# Patient Record
Sex: Female | Born: 1965 | Race: Black or African American | Hispanic: No | Marital: Single | Smoking: Former smoker
Health system: Southern US, Community
[De-identification: ages and names within clinical notes are randomized; demographics above are authoritative.]

## PROBLEM LIST (undated history)

## (undated) DIAGNOSIS — J45909 Unspecified asthma, uncomplicated: Secondary | ICD-10-CM

## (undated) HISTORY — PX: BREAST LUMPECTOMY: SHX2

## (undated) HISTORY — PX: BREAST EXCISIONAL BIOPSY: SUR124

## (undated) HISTORY — PX: WRIST GANGLION EXCISION: SHX840

---

## 2000-03-05 ENCOUNTER — Encounter (INDEPENDENT_AMBULATORY_CARE_PROVIDER_SITE_OTHER): Payer: Self-pay | Admitting: Specialist

## 2000-03-05 ENCOUNTER — Ambulatory Visit (HOSPITAL_BASED_OUTPATIENT_CLINIC_OR_DEPARTMENT_OTHER): Admission: RE | Admit: 2000-03-05 | Discharge: 2000-03-05 | Payer: Self-pay | Admitting: Orthopedic Surgery

## 2000-07-09 ENCOUNTER — Encounter: Payer: Self-pay | Admitting: Internal Medicine

## 2000-07-09 ENCOUNTER — Encounter: Admission: RE | Admit: 2000-07-09 | Discharge: 2000-07-09 | Payer: Self-pay | Admitting: Internal Medicine

## 2002-04-01 ENCOUNTER — Encounter (INDEPENDENT_AMBULATORY_CARE_PROVIDER_SITE_OTHER): Payer: Self-pay | Admitting: *Deleted

## 2002-04-01 ENCOUNTER — Ambulatory Visit (HOSPITAL_BASED_OUTPATIENT_CLINIC_OR_DEPARTMENT_OTHER): Admission: RE | Admit: 2002-04-01 | Discharge: 2002-04-01 | Payer: Self-pay | Admitting: General Surgery

## 2003-03-04 ENCOUNTER — Encounter: Admission: RE | Admit: 2003-03-04 | Discharge: 2003-03-04 | Payer: Self-pay | Admitting: General Surgery

## 2003-03-04 ENCOUNTER — Encounter (INDEPENDENT_AMBULATORY_CARE_PROVIDER_SITE_OTHER): Payer: Self-pay | Admitting: *Deleted

## 2003-03-04 ENCOUNTER — Ambulatory Visit (HOSPITAL_BASED_OUTPATIENT_CLINIC_OR_DEPARTMENT_OTHER): Admission: RE | Admit: 2003-03-04 | Discharge: 2003-03-04 | Payer: Self-pay | Admitting: General Surgery

## 2003-03-04 ENCOUNTER — Encounter: Payer: Self-pay | Admitting: General Surgery

## 2003-03-04 ENCOUNTER — Ambulatory Visit (HOSPITAL_COMMUNITY): Admission: RE | Admit: 2003-03-04 | Discharge: 2003-03-04 | Payer: Self-pay | Admitting: General Surgery

## 2003-07-20 ENCOUNTER — Ambulatory Visit (HOSPITAL_COMMUNITY): Admission: RE | Admit: 2003-07-20 | Discharge: 2003-07-20 | Payer: Self-pay | Admitting: Obstetrics & Gynecology

## 2003-09-17 ENCOUNTER — Inpatient Hospital Stay (HOSPITAL_COMMUNITY): Admission: AD | Admit: 2003-09-17 | Discharge: 2003-09-17 | Payer: Self-pay | Admitting: Obstetrics

## 2003-10-08 ENCOUNTER — Ambulatory Visit (HOSPITAL_COMMUNITY): Admission: RE | Admit: 2003-10-08 | Discharge: 2003-10-08 | Payer: Self-pay | Admitting: Obstetrics & Gynecology

## 2003-10-23 ENCOUNTER — Inpatient Hospital Stay (HOSPITAL_COMMUNITY): Admission: AD | Admit: 2003-10-23 | Discharge: 2003-10-23 | Payer: Self-pay | Admitting: Obstetrics

## 2003-11-26 ENCOUNTER — Inpatient Hospital Stay (HOSPITAL_COMMUNITY): Admission: AD | Admit: 2003-11-26 | Discharge: 2003-11-26 | Payer: Self-pay | Admitting: Obstetrics

## 2003-12-16 ENCOUNTER — Inpatient Hospital Stay (HOSPITAL_COMMUNITY): Admission: AD | Admit: 2003-12-16 | Discharge: 2003-12-16 | Payer: Self-pay | Admitting: Obstetrics & Gynecology

## 2003-12-17 ENCOUNTER — Inpatient Hospital Stay (HOSPITAL_COMMUNITY): Admission: AD | Admit: 2003-12-17 | Discharge: 2003-12-20 | Payer: Self-pay | Admitting: Obstetrics & Gynecology

## 2004-04-20 ENCOUNTER — Ambulatory Visit (HOSPITAL_COMMUNITY): Admission: RE | Admit: 2004-04-20 | Discharge: 2004-04-20 | Payer: Self-pay | Admitting: Obstetrics & Gynecology

## 2006-01-16 ENCOUNTER — Encounter: Admission: RE | Admit: 2006-01-16 | Discharge: 2006-01-16 | Payer: Self-pay | Admitting: Obstetrics & Gynecology

## 2006-02-20 ENCOUNTER — Encounter: Admission: RE | Admit: 2006-02-20 | Discharge: 2006-02-20 | Payer: Self-pay | Admitting: General Surgery

## 2009-04-04 ENCOUNTER — Encounter: Admission: RE | Admit: 2009-04-04 | Discharge: 2009-04-04 | Payer: Self-pay | Admitting: Obstetrics

## 2009-08-09 ENCOUNTER — Ambulatory Visit: Payer: Self-pay | Admitting: Interventional Radiology

## 2009-08-09 ENCOUNTER — Emergency Department (HOSPITAL_BASED_OUTPATIENT_CLINIC_OR_DEPARTMENT_OTHER): Admission: EM | Admit: 2009-08-09 | Discharge: 2009-08-10 | Payer: Self-pay | Admitting: Emergency Medicine

## 2010-05-09 ENCOUNTER — Encounter: Admission: RE | Admit: 2010-05-09 | Discharge: 2010-05-09 | Payer: Self-pay | Admitting: Obstetrics

## 2010-07-01 ENCOUNTER — Encounter: Payer: Self-pay | Admitting: Obstetrics & Gynecology

## 2010-07-02 ENCOUNTER — Encounter: Payer: Self-pay | Admitting: Obstetrics & Gynecology

## 2010-10-27 NOTE — Op Note (Signed)
   NAME:  Whitney Williams, Whitney Williams                       ACCOUNT NO.:  [id number]   MEDICAL RECORD NO.:  [id number]                   PATIENT TYPE:  AMB   LOCATION:  DSC                                  FACILITY:  [facility]   PHYSICIAN:  Rose Phi. Maple Hudson, M.D.                DATE OF BIRTH:  23-May-1966   DATE OF PROCEDURE:  03/04/2003  DATE OF DISCHARGE:                                 OPERATIVE REPORT   PREOPERATIVE DIAGNOSIS:  Fibroadenoma of the right breast.   POSTOPERATIVE DIAGNOSIS:  Fibroadenoma of the right breast.   OPERATION:  Excision of right breast mass with needle localization and  specimen mammography.   SURGEON:  Rose Phi. Maple Hudson, M.D.   ANESTHESIA:  MAC.   OPERATIVE PROCEDURE:  The patient was placed on the operating table with the  right arm extended on the arm board and the breast prepped and draped in the  usual fashion.  A curvilinear incision centered on the wire, previously  positioned, was then outlined at about the 7 o'clock position of the right  breast.  After infiltrating with 1% Xylocaine, a curvilinear incision was  then made and then the wire and surrounding tissue were excised.   Specimen mammography confirmed the removal of the lesion.   Hemostasis obtained with the cautery.  Skin closed with interrupted 4-0  Monocryl in a subcuticular fashion.  Steri-Strips applied.  Dressing applied  and patient transferred to recovery room in satisfactory condition, having  tolerated the procedure well.                                               Rose Phi. Maple Hudson, M.D.    PRY/MEDQ  D:  03/04/2003  T:  03/04/2003  Job:  [id number]

## 2010-10-27 NOTE — Discharge Summary (Signed)
NAME:  Whitney Williams, Whitney Williams                       ACCOUNT NO.:  [id number]   MEDICAL RECORD NO.:  [id number]                   PATIENT TYPE:  INP   LOCATION:  9116                                 FACILITY:  [facility]   PHYSICIAN:  Charles A. Clearance Coots, M.D.             DATE OF BIRTH:  Nov 17, 1965   DATE OF ADMISSION:  12/17/2003  DATE OF DISCHARGE:  12/20/2003                                 DISCHARGE SUMMARY   ADMITTING DIAGNOSES:  1. Thirty-nine weeks gestation.  2. Active labor.   DISCHARGE DIAGNOSES:  1. Thirty-nine weeks gestation.  2. Active labor.  3. Status post normal spontaneous vaginal delivery of viable female on December 17, 2003; Apgars of 9 at one minute, 9 at five minutes; weight of 3305 g.     Mother and infant discharged home in good condition.   REASON FOR ADMISSION:  A 45 year old G3 P2 with estimated date of  confinement of December 22, 2003 presents with uterine contractions every 2-6  minutes.  The patient was kept for observation to rule out labor.  Uterine  contractions became stronger and cervical exam changed and the patient was  therefore admitted for early active labor.  Prenatal course was  uncomplicated.  Group B strep was positive.  The patient's past GYN history  is significant for fibroids.   PAST MEDICAL HISTORY:  Surgery:  None.  Illnesses:  None.   MEDICATIONS:  Prenatal vitamins.   ALLERGIES:  No known drug allergies.   SOCIAL HISTORY:  Single.  Negative tobacco, alcohol, or recreational drug  use.   PHYSICAL EXAMINATION:  VITAL SIGNS:  Temperature 98, vital signs were  stable.  LUNGS:  Clear to auscultation bilaterally.  HEART:  Regular rate and rhythm.  ABDOMEN:  Gravid, nontender.  PELVIC:  Cervix 5 cm dilated, 90% effaced, and the vertex at a -1 station.   ADMITTING LABORATORY VALUES:  Hemoglobin 10.9; hematocrit 34.8; white blood  cell count 7200; platelets 221,000.   HOSPITAL COURSE:  The patient was admitted and progressed quite well in  labor.  Membranes were actively ruptured at 1500 and revealed meconium-  stained fluid.  Intrauterine pressure catheter and internal fetal scalp  electrode were applied.  Amnioinfusion was begun.  The patient reached full  dilatation of the cervix at 1800 and progressed to a vaginal delivery at  2101.  There were no intrapartum complications.  The postpartum course was  complicated by postpartum endometritis.  The patient was started on IV  antibiotic therapy and did quite well and was discharged home much improved  on postpartum day #3.  Endometritis resolved.   DISCHARGE LABORATORY VALUES:  Hemoglobin 10.3; hematocrit 32.6; white blood  cell count 11,700; platelets 222,000.   DISCHARGE DISPOSITION:  1. Medication:  Tylox and ibuprofen prescribed for pain; Levaquin prescribed     for completion of antibiotic therapy for endometritis.  2. Routine written instructions per booklet for obstetrical  discharge was     given.  3. The patient is to follow up in the office in 6 weeks for a postpartum     check.                                               Charles A. Clearance Coots, M.D.    CAH/MEDQ  D:  01/21/2004  T:  01/21/2004  Job:  [id number]

## 2010-10-27 NOTE — Op Note (Signed)
   NAME:  Whitney Williams, Whitney Williams                       ACCOUNT NO.:  [id number]   MEDICAL RECORD NO.:  [id number]                   PATIENT TYPE:  AMB   LOCATION:  DSC                                  FACILITY:  [facility]   PHYSICIAN:  Rose Phi. Maple Hudson, M.D.                DATE OF BIRTH:  09/29/1965   DATE OF PROCEDURE:  04/01/2002  DATE OF DISCHARGE:                                 OPERATIVE REPORT   PREOPERATIVE DIAGNOSES:  Probable fibroadenoma of the right breast.   POSTOPERATIVE DIAGNOSES:  Nodular breast tissue with possible fibroadenoma.   OPERATION:  Excision of right breast mass.   SURGEON:  Rose Phi. Maple Hudson, M.D.   ANESTHESIA:  MAC.   DESCRIPTION OF PROCEDURE:  This patient had had a mammogram done because she  had some pain in her left breast. The left breast was normal but the  mammogram showed an ovoid small mass at the 8 o'clock position. Ultrasound  made it look like a fibroadenoma. She desired excision.   She was placed on the operating table and I was really unable to palpate it,  so we got the ultrasound and with the 7.5 MHz transducer, we were able to  identify it at about the 8:30 position. I marked that.   We then prepped and draped the breast in a standard fashion. A small curved  incision was made overlying this area and the area was infiltrated with 1%  Xylocaine with adrenaline. An incision was made and with sharp dissection,  we excised this nodular area of breast tissue. I really could not clearly  identify a fibroadenoma. I also could not palpate one with my fingers in the  incision specifically other than the nodular breast tissue including a small  lipoma that we excised. Hemostasis was obtained with the cautery.  Subcuticular closure with 4-0 Monocryl and Steri-Strips carried out.  Dressing applied. The patient transferred to the recovery room in  satisfactory condition having tolerated the procedure well.                                               Rose Phi. Maple Hudson, M.D.    PRY/MEDQ  D:  04/01/2002  T:  04/01/2002  Job:  [id number]

## 2010-10-27 NOTE — Op Note (Signed)
[place]. [facility]  Patient:    Whitney Williams, Whitney Williams                      MRN: [id number] Proc. Date: 03/05/00 Adm. Date:  [id number] Attending:  Susa Day                           Operative Report  PREOPERATIVE DIAGNOSIS:  Painful right dorsal ganglion.  POSTOPERATIVE DIAGNOSIS:  Painful right dorsal ganglion.  PROCEDURE:  Excision of a right dorsal ganglion.  SURGEON:  Katy Fitch. Sypher, Montez Hageman., M.D.  ASSISTANT:  Jonni Sanger, P.A.  ANESTHESIA:  IV regional supervised by the anesthesiologist, J. Claybon Jabs, M.D.  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  She has a history of experiencing ganglion formation of both hands.  She now has an enlarging dorsal ganglion on the right that is painful and interfering with function. She requested this to be removed.  DESCRIPTION OF PROCEDURE:  [name] was brought to the operating room and placed in the supine position on the operating table.  Following light sedation, an IV regional block was placed on the right.  When anesthesia was satisfactory, the procedure commenced with a short transverse incision directly over the mass.  Subcutaneous tissue was carefully divided, taking care to identify the extensor retinaculum.  This was carefully retracted in a proximal direction, and the cyst was circumferentially dissected down to the dorsal wrist capsule.  This was exiting directly over the scapholunate ligament.  The entire cyst and the portion of the capsule with the origin of the cyst was removed.  Bleeding points were electrocauterized with bipolar current.  The scapholunate and interosseous ligament were inspected and found to be otherwise normal.  The capsule was repaired with mattress suture of 4-0 Vicryl followed by repair of the skin with intradermal 3-0 Prolene suture.  There were no  apparent complications.  [name] tolerated the surgery and anesthesia well.  She was transported to the recovery room with stable vital signs. DD:  03/05/00 TD:  03/06/00 Job: [id number]

## 2012-10-16 ENCOUNTER — Ambulatory Visit (INDEPENDENT_AMBULATORY_CARE_PROVIDER_SITE_OTHER): Payer: BC Managed Care – PPO | Admitting: Obstetrics

## 2012-10-16 ENCOUNTER — Ambulatory Visit: Payer: Self-pay | Admitting: Obstetrics

## 2012-10-16 ENCOUNTER — Encounter: Payer: Self-pay | Admitting: Obstetrics

## 2012-10-16 VITALS — BP 123/77 | HR 100 | Temp 98.1°F | Ht 63.0 in | Wt 151.0 lb

## 2012-10-16 DIAGNOSIS — N644 Mastodynia: Secondary | ICD-10-CM | POA: Insufficient documentation

## 2012-10-16 DIAGNOSIS — Z3041 Encounter for surveillance of contraceptive pills: Secondary | ICD-10-CM

## 2012-10-16 DIAGNOSIS — N760 Acute vaginitis: Secondary | ICD-10-CM

## 2012-10-16 DIAGNOSIS — Z01419 Encounter for gynecological examination (general) (routine) without abnormal findings: Secondary | ICD-10-CM

## 2012-10-16 DIAGNOSIS — N920 Excessive and frequent menstruation with regular cycle: Secondary | ICD-10-CM

## 2012-10-16 DIAGNOSIS — J309 Allergic rhinitis, unspecified: Secondary | ICD-10-CM

## 2012-10-16 DIAGNOSIS — J302 Other seasonal allergic rhinitis: Secondary | ICD-10-CM

## 2012-10-16 DIAGNOSIS — Z113 Encounter for screening for infections with a predominantly sexual mode of transmission: Secondary | ICD-10-CM

## 2012-10-16 MED ORDER — NORETHIN ACE-ETH ESTRAD-FE 1-20 MG-MCG(24) PO CHEW: 1.0000 | CHEWABLE_TABLET | Freq: Every day | ORAL | Status: DC

## 2012-10-16 MED ORDER — LORATADINE 10 MG PO TABS: 10.0000 mg | ORAL_TABLET | Freq: Every day | ORAL | Status: DC

## 2012-10-16 MED ORDER — NORETHIN ACE-ETH ESTRAD-FE 1-20 MG-MCG(24) PO TABS: 1.0000 | ORAL_TABLET | Freq: Every day | ORAL | Status: DC

## 2012-10-16 MED ORDER — PRENATAL PLUS IRON 29-1 MG PO TABS: 1.0000 | ORAL_TABLET | Freq: Every day | ORAL | Status: DC

## 2012-10-16 NOTE — Patient Instructions (Signed)
Self breast exams Contraceptive options

## 2012-10-16 NOTE — Progress Notes (Signed)
Subjective:     Whitney Williams is a 47 y.o. female here for a routine exam.  Current complaints: right breast pain x 2 weeks.     Gynecologic History Patient's last menstrual period was 10/09/2012. Contraception: abstinence Last Pap: 12/02/2008. Results were: normal Last mammogram: 02/15/2012. Results were: normal    The following portions of the patient's history were reviewed and updated as appropriate: allergies, current medications, past family history, past medical history, past social history, past surgical history and problem list.  Review of Systems Pertinent items are noted in HPI.    Objective:    General appearance: alert and no distress Breasts: normal appearance, no masses or tenderness Abdomen: normal findings: soft, non-tender Pelvic: cervix normal in appearance, external genitalia normal, no adnexal masses or tenderness, no cervical motion tenderness, uterus normal size, shape, and consistency and vagina normal without discharge    Assessment:    Healthy female exam.    Plan:    Education reviewed: low fat, low cholesterol diet, self breast exams and weight bearing exercise. Contraception: OCP (estrogen/progesterone). Mammogram ordered. Follow up in: 1 year.

## 2012-10-17 ENCOUNTER — Ambulatory Visit: Payer: Self-pay | Admitting: Obstetrics

## 2012-10-17 LAB — RPR

## 2012-10-17 LAB — WET PREP BY MOLECULAR PROBE
Candida species: NEGATIVE
Trichomonas vaginosis: NEGATIVE

## 2012-10-17 LAB — PAP IG W/ RFLX HPV ASCU

## 2012-11-11 ENCOUNTER — Other Ambulatory Visit: Payer: Self-pay

## 2012-11-20 ENCOUNTER — Ambulatory Visit (INDEPENDENT_AMBULATORY_CARE_PROVIDER_SITE_OTHER): Payer: BC Managed Care – PPO | Admitting: Obstetrics

## 2012-11-20 ENCOUNTER — Encounter: Payer: Self-pay | Admitting: Obstetrics

## 2012-11-20 VITALS — BP 107/72 | HR 89 | Temp 98.9°F | Wt 153.0 lb

## 2012-11-20 DIAGNOSIS — B839 Helminthiasis, unspecified: Secondary | ICD-10-CM

## 2012-11-20 DIAGNOSIS — N644 Mastodynia: Secondary | ICD-10-CM | POA: Insufficient documentation

## 2012-11-20 NOTE — Progress Notes (Signed)
Subjective:     Whitney Williams is a 47 y.o. female here for a routine exam.  Current complaints: bilateral breast pain. Pt states she has continued to have pain since Oct 14, 2012 . [facility]  Personal health questionnaire reviewed: yes.   Gynecologic History Patient's last menstrual period was 11/13/2012. Contraception: OCP (estrogen/progesterone) Last Pap: 2014. Results were: normal Last mammogram: Nov. 2013. Results were: normal  Obstetric History OB History   Grav Para Term Preterm Abortions TAB SAB Ect Mult Living                   The following portions of the patient's history were reviewed and updated as appropriate: allergies, current medications, past family history, past medical history, past social history, past surgical history and problem list.  Review of Systems Pertinent items are noted in HPI.    Objective:    Breasts: Bilateral tenderness.      Assessment:    Healthy female exam.   Mastodynia   Plan:    Education reviewed: self breast exams. Contraception: OCP (estrogen/progesterone). Mammogram ordered. [facility].

## 2012-12-08 ENCOUNTER — Ambulatory Visit
Admission: RE | Admit: 2012-12-08 | Discharge: 2012-12-08 | Disposition: A | Discharge status: Home / Self Care | Payer: BC Managed Care – PPO | Source: Ambulatory Visit | Attending: Obstetrics | Admitting: Obstetrics

## 2012-12-08 ENCOUNTER — Ambulatory Visit: Payer: Self-pay | Admitting: Obstetrics

## 2012-12-08 DIAGNOSIS — N644 Mastodynia: Secondary | ICD-10-CM

## 2013-01-19 ENCOUNTER — Other Ambulatory Visit: Payer: Self-pay | Admitting: Obstetrics

## 2013-01-19 DIAGNOSIS — Z0000 Encounter for general adult medical examination without abnormal findings: Secondary | ICD-10-CM

## 2013-01-19 MED ORDER — PNV PRENATAL PLUS MULTIVITAMIN 27-1 MG PO TABS: 1.0000 | ORAL_TABLET | Freq: Every day | ORAL | Status: DC

## 2013-06-25 ENCOUNTER — Other Ambulatory Visit: Payer: Self-pay | Admitting: Obstetrics

## 2013-06-25 DIAGNOSIS — Z3041 Encounter for surveillance of contraceptive pills: Secondary | ICD-10-CM

## 2013-06-25 MED ORDER — NORETHIN ACE-ETH ESTRAD-FE 1-20 MG-MCG(24) PO CHEW: 1.0000 | CHEWABLE_TABLET | Freq: Every day | ORAL | Status: DC

## 2013-11-17 ENCOUNTER — Other Ambulatory Visit: Payer: Self-pay | Admitting: *Deleted

## 2013-11-17 DIAGNOSIS — J329 Chronic sinusitis, unspecified: Secondary | ICD-10-CM

## 2013-11-17 DIAGNOSIS — B379 Candidiasis, unspecified: Secondary | ICD-10-CM

## 2013-11-17 MED ORDER — AZITHROMYCIN 250 MG PO TABS: 250.0000 mg | ORAL_TABLET | Freq: Every day | ORAL | Status: DC

## 2013-11-17 MED ORDER — FLUCONAZOLE 150 MG PO TABS: 150.0000 mg | ORAL_TABLET | Freq: Once | ORAL | Status: DC

## 2014-01-27 ENCOUNTER — Other Ambulatory Visit: Payer: Self-pay | Admitting: *Deleted

## 2014-01-27 DIAGNOSIS — Z3041 Encounter for surveillance of contraceptive pills: Secondary | ICD-10-CM

## 2014-01-27 MED ORDER — NORETHIN ACE-ETH ESTRAD-FE 1-20 MG-MCG(24) PO CHEW: 1.0000 | CHEWABLE_TABLET | Freq: Every day | ORAL | Status: DC

## 2014-01-31 ENCOUNTER — Other Ambulatory Visit: Payer: Self-pay | Admitting: Obstetrics

## 2014-02-08 ENCOUNTER — Other Ambulatory Visit: Payer: Self-pay | Admitting: Obstetrics

## 2014-05-12 ENCOUNTER — Emergency Department (HOSPITAL_COMMUNITY): Payer: BC Managed Care – PPO

## 2014-05-12 ENCOUNTER — Emergency Department (HOSPITAL_COMMUNITY)
Admission: EM | Admit: 2014-05-12 | Discharge: 2014-05-12 | Disposition: A | Discharge status: Home / Self Care | Payer: BC Managed Care – PPO | Attending: Emergency Medicine | Admitting: Emergency Medicine

## 2014-05-12 ENCOUNTER — Encounter (HOSPITAL_COMMUNITY): Payer: Self-pay | Admitting: *Deleted

## 2014-05-12 DIAGNOSIS — Y998 Other external cause status: Secondary | ICD-10-CM | POA: Insufficient documentation

## 2014-05-12 DIAGNOSIS — M549 Dorsalgia, unspecified: Secondary | ICD-10-CM

## 2014-05-12 DIAGNOSIS — Z87891 Personal history of nicotine dependence: Secondary | ICD-10-CM | POA: Diagnosis not present

## 2014-05-12 DIAGNOSIS — Z793 Long term (current) use of hormonal contraceptives: Secondary | ICD-10-CM | POA: Insufficient documentation

## 2014-05-12 DIAGNOSIS — S199XXA Unspecified injury of neck, initial encounter: Secondary | ICD-10-CM | POA: Diagnosis not present

## 2014-05-12 DIAGNOSIS — S0990XA Unspecified injury of head, initial encounter: Secondary | ICD-10-CM | POA: Insufficient documentation

## 2014-05-12 DIAGNOSIS — S24109A Unspecified injury at unspecified level of thoracic spinal cord, initial encounter: Secondary | ICD-10-CM | POA: Diagnosis not present

## 2014-05-12 DIAGNOSIS — Y9389 Activity, other specified: Secondary | ICD-10-CM | POA: Insufficient documentation

## 2014-05-12 DIAGNOSIS — R519 Headache, unspecified: Secondary | ICD-10-CM

## 2014-05-12 DIAGNOSIS — Z79899 Other long term (current) drug therapy: Secondary | ICD-10-CM | POA: Diagnosis not present

## 2014-05-12 DIAGNOSIS — Y92410 Unspecified street and highway as the place of occurrence of the external cause: Secondary | ICD-10-CM | POA: Insufficient documentation

## 2014-05-12 DIAGNOSIS — S3992XA Unspecified injury of lower back, initial encounter: Secondary | ICD-10-CM | POA: Diagnosis present

## 2014-05-12 DIAGNOSIS — Z792 Long term (current) use of antibiotics: Secondary | ICD-10-CM | POA: Insufficient documentation

## 2014-05-12 DIAGNOSIS — R51 Headache: Secondary | ICD-10-CM

## 2014-05-12 MED ORDER — ACETAMINOPHEN-CODEINE #3 300-30 MG PO TABS: 1.0000 | ORAL_TABLET | Freq: Four times a day (QID) | ORAL | Status: DC | PRN

## 2014-05-12 MED ORDER — ACETAMINOPHEN 325 MG PO TABS
650.0000 mg | ORAL_TABLET | Freq: Once | ORAL | Status: AC
  Administered 2014-05-12: 650 mg via ORAL
  Filled 2014-05-12: qty 2

## 2014-05-12 MED ORDER — TRAMADOL HCL 50 MG PO TABS
50.0000 mg | ORAL_TABLET | Freq: Once | ORAL | Status: AC
  Administered 2014-05-12: 50 mg via ORAL
  Filled 2014-05-12: qty 1
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
## 2014-05-12 NOTE — Discharge Instructions (Signed)
Take the prescribed medication as directed.  Use caution, may cause some drowsiness. Follow-up with your primary care physician. Return to the ED for new or worsening symptoms.

## 2014-05-12 NOTE — ED Notes (Signed)
Bed: WA12 Expected date:  Expected time:  Means of arrival:  Comments: EMS 
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
## 2014-05-12 NOTE — ED Notes (Signed)
[place] Cervical Collar.placed on patient.

## 2014-05-12 NOTE — ED Notes (Signed)
Patient transported to X-ray 

## 2014-06-01 ENCOUNTER — Other Ambulatory Visit: Payer: Self-pay | Admitting: *Deleted

## 2014-06-01 DIAGNOSIS — Z3041 Encounter for surveillance of contraceptive pills: Secondary | ICD-10-CM

## 2014-06-01 MED ORDER — NORETHIN ACE-ETH ESTRAD-FE 1-20 MG-MCG PO TABS: 1.0000 | ORAL_TABLET | Freq: Every day | ORAL | Status: DC

## 2014-06-02 ENCOUNTER — Other Ambulatory Visit: Payer: Self-pay | Admitting: *Deleted

## 2014-06-02 DIAGNOSIS — Z01419 Encounter for gynecological examination (general) (routine) without abnormal findings: Secondary | ICD-10-CM

## 2014-06-02 MED ORDER — PRENATAL PLUS IRON 29-1 MG PO TABS: 1.0000 | ORAL_TABLET | Freq: Every day | ORAL | Status: DC

## 2014-06-30 ENCOUNTER — Telehealth: Payer: Self-pay | Admitting: *Deleted

## 2014-06-30 NOTE — Telephone Encounter (Signed)
Patient called to request samples. Patient states she is using Lo Loestrin and has not had a cycle. Patient wants Lo Estrin. 2:20 Called patient - LM to CB- have samples of LO Loestrin, but not Lo Estrin. Need to know which one she wants.

## 2014-07-07 NOTE — Telephone Encounter (Signed)
No response- call re filed

## 2014-07-22 ENCOUNTER — Telehealth: Payer: Self-pay | Admitting: *Deleted

## 2014-07-22 DIAGNOSIS — J019 Acute sinusitis, unspecified: Secondary | ICD-10-CM

## 2014-07-22 MED ORDER — AZITHROMYCIN 250 MG PO TABS: ORAL_TABLET | ORAL | Status: DC

## 2014-07-22 MED ORDER — FLUCONAZOLE 150 MG PO TABS: 150.0000 mg | ORAL_TABLET | Freq: Once | ORAL | Status: DC

## 2014-07-22 NOTE — Telephone Encounter (Signed)
Patient called sick with sinus infection. She has been sick since last Wednesday and is requesting antibiotic. She has sinus drainage, thick mucus. She has been using musinex and Tylenol cold. Per Dr Jodi Mourning- OK to send Zpack and Diflucan as requested. Patient advised and she is to make appointment with primary care if her symptoms do not improve.

## 2014-09-02 ENCOUNTER — Ambulatory Visit: Payer: BC Managed Care – PPO | Admitting: Obstetrics

## 2014-12-01 ENCOUNTER — Encounter: Payer: Self-pay | Admitting: Obstetrics

## 2014-12-01 ENCOUNTER — Ambulatory Visit (INDEPENDENT_AMBULATORY_CARE_PROVIDER_SITE_OTHER): Payer: BLUE CROSS/BLUE SHIELD | Admitting: Obstetrics

## 2014-12-01 VITALS — BP 116/81 | HR 86 | Temp 97.6°F | Ht 63.0 in | Wt 162.0 lb

## 2014-12-01 DIAGNOSIS — Z113 Encounter for screening for infections with a predominantly sexual mode of transmission: Secondary | ICD-10-CM

## 2014-12-01 DIAGNOSIS — Z124 Encounter for screening for malignant neoplasm of cervix: Secondary | ICD-10-CM | POA: Diagnosis not present

## 2014-12-01 DIAGNOSIS — J302 Other seasonal allergic rhinitis: Secondary | ICD-10-CM

## 2014-12-01 DIAGNOSIS — Z01419 Encounter for gynecological examination (general) (routine) without abnormal findings: Secondary | ICD-10-CM

## 2014-12-01 DIAGNOSIS — Z3041 Encounter for surveillance of contraceptive pills: Secondary | ICD-10-CM | POA: Diagnosis not present

## 2014-12-01 LAB — COMPREHENSIVE METABOLIC PANEL
ALT: 15 U/L (ref 0–35)
AST: 13 U/L (ref 0–37)
Albumin: 3.8 g/dL (ref 3.5–5.2)
Alkaline Phosphatase: 42 U/L (ref 39–117)
BUN: 10 mg/dL (ref 6–23)
CO2: 22 meq/L (ref 19–32)
Calcium: 8.8 mg/dL (ref 8.4–10.5)
Chloride: 106 mEq/L (ref 96–112)
Creat: 0.81 mg/dL (ref 0.50–1.10)
Glucose, Bld: 79 mg/dL (ref 70–99)
Potassium: 4 mEq/L (ref 3.5–5.3)
Sodium: 138 mEq/L (ref 135–145)
Total Bilirubin: 0.6 mg/dL (ref 0.2–1.2)
Total Protein: 6.8 g/dL (ref 6.0–8.3)

## 2014-12-01 LAB — CBC
HCT: 38.3 % (ref 36.0–46.0)
HEMOGLOBIN: 12.1 g/dL (ref 12.0–15.0)
MCH: 21.6 pg — ABNORMAL LOW (ref 26.0–34.0)
MCHC: 31.6 g/dL (ref 30.0–36.0)
MCV: 68.5 fL — ABNORMAL LOW (ref 78.0–100.0)
MPV: 8.9 fL (ref 8.6–12.4)
PLATELETS: 336 10*3/uL (ref 150–400)
RBC: 5.59 MIL/uL — AB (ref 3.87–5.11)
RDW: 16.4 % — ABNORMAL HIGH (ref 11.5–15.5)
WBC: 7.5 10*3/uL (ref 4.0–10.5)

## 2014-12-01 LAB — TSH: TSH: 1.053 u[IU]/mL (ref 0.350–4.500)

## 2014-12-01 MED ORDER — PREPLUS 27-1 MG PO TABS: ORAL_TABLET | ORAL | Status: DC

## 2014-12-01 MED ORDER — MINASTRIN 24 FE 1-20 MG-MCG(24) PO CHEW: 1.0000 | CHEWABLE_TABLET | Freq: Every day | ORAL | Status: DC

## 2014-12-01 MED ORDER — NORETHIN ACE-ETH ESTRAD-FE 1-20 MG-MCG(24) PO CHEW: 1.0000 | CHEWABLE_TABLET | Freq: Every day | ORAL | Status: DC

## 2014-12-01 MED ORDER — LORATADINE 10 MG PO TABS: 10.0000 mg | ORAL_TABLET | Freq: Every day | ORAL | Status: DC

## 2014-12-01 NOTE — Progress Notes (Signed)
Subjective:        Whitney Williams is a 49 y.o. female here for a routine exam.  Current complaints: None.  Periods are well regulated on OCP's.  Allergies well controlled with Claritin.  Personal health questionnaire:  Is patient [name], have a family history of breast and/or ovarian cancer: no Is there a family history of uterine cancer diagnosed at age < 48, gastrointestinal cancer, urinary tract cancer, family member who is a Field seismologist syndrome-associated carrier: no Is the patient overweight and hypertensive, family history of diabetes, personal history of gestational diabetes, preeclampsia or PCOS: no Is patient over 45, have PCOS,  family history of premature CHD under age 76, diabetes, smoke, have hypertension or peripheral artery disease:  no At any time, has a partner hit, kicked or otherwise hurt or frightened you?: no Over the past 2 weeks, have you felt down, depressed or hopeless?: no Over the past 2 weeks, have you felt little interest or pleasure in doing things?:no   Gynecologic History No LMP recorded. Contraception: OCP (estrogen/progesterone) Last Pap: 2014. Results were: normal Last mammogram: 2014. Results were: normal  Obstetric History OB History  No data available    History reviewed. No pertinent past medical history.  Past Surgical History  Procedure Laterality Date  . Breast lumpectomy Right 2003 and 2004    x 2  . Wrist ganglion excision      x 5     Current outpatient prescriptions:  .  loratadine (CLARITIN) 10 MG tablet, Take 1 tablet (10 mg total) by mouth daily., Disp: 30 tablet, Rfl: 11 .  Prenatal Vit-Fe Fumarate-FA (PREPLUS) 27-1 MG TABS, TAKE ONE TABLET BY MOUTH ONCE DAILY BEFORE BREAKFAST, Disp: 90 tablet, Rfl: 0 .  Prenatal Vit-Iron Carbonyl-FA (PRENATAL PLUS IRON) 29-1 MG TABS, Take 1 tablet by mouth daily before breakfast., Disp: 30 tablet, Rfl: 11 .  acetaminophen-codeine (TYLENOL #3) 300-30 MG per tablet, Take 1-2 tablets  by mouth every 6 (six) hours as needed for moderate pain. (Patient not taking: Reported on 12/01/2014), Disp: 20 tablet, Rfl: 0 .  azithromycin (ZITHROMAX Z-PAK) 250 MG tablet, Use as directed (Patient not taking: Reported on 12/01/2014), Disp: 6 tablet, Rfl: 0 .  fluconazole (DIFLUCAN) 150 MG tablet, Take 1 tablet (150 mg total) by mouth once. (Patient not taking: Reported on 12/01/2014), Disp: 1 tablet, Rfl: 0 .  MINASTRIN 24 FE 1-20 MG-MCG(24) CHEW, Chew 1 tablet by mouth daily before breakfast., Disp: 28 tablet, Rfl: 11 .  Prenatal Vit-Fe Fumarate-FA (PREPLUS) 27-1 MG TABS, TAKE ONE TABLET BY MOUTH ONCE DAILY BEFORE BREAKFAST, Disp: 90 tablet, Rfl: 3 No Known Allergies  History  Substance Use Topics  . Smoking status: Former Smoker -- 0.25 packs/day    Types: Cigarettes    Quit date: 10/17/2007  . Smokeless tobacco: Never Used  . Alcohol Use: No    Family History  Problem Relation Age of Onset  . Breast cancer      maternal cousins x 2      Review of Systems  Constitutional: negative for fatigue and weight loss Respiratory: negative for cough and wheezing Cardiovascular: negative for chest pain, fatigue and palpitations Gastrointestinal: negative for abdominal pain and change in bowel habits Musculoskeletal:negative for myalgias Neurological: negative for gait problems and tremors Behavioral/Psych: negative for abusive relationship, depression Endocrine: negative for temperature intolerance   Genitourinary:negative for abnormal menstrual periods, genital lesions, hot flashes, sexual problems and vaginal discharge Integument/breast: negative for breast lump, breast tenderness, nipple discharge  and skin lesion(s)    Objective:       BP 116/81 mmHg  Pulse 86  Temp(Src) 97.6 F (36.4 C)  Ht 5\' 3"  (1.6 m)  Wt 162 lb (73.483 kg)  BMI 28.70 kg/m2 General:   alert  Skin:   no rash or abnormalities  Lungs:   clear to auscultation bilaterally  Heart:   regular rate and rhythm,  S1, S2 normal, no murmur, click, rub or gallop  Breasts:   normal without suspicious masses, skin or nipple changes or axillary nodes  Abdomen:  normal findings: no organomegaly, soft, non-tender and no hernia  Pelvis:  External genitalia: normal general appearance Urinary system: urethral meatus normal and bladder without fullness, nontender Vaginal: normal without tenderness, induration or masses Cervix: normal appearance Adnexa: normal bimanual exam Uterus: anteverted and non-tender, normal size   Lab Review Urine pregnancy test Labs reviewed yes Radiologic studies reviewed yes    Assessment:    Healthy female exam.    Contraceptive surveillance  STI screening Allergic rhinitis, seasonal   Plan:   Minastrin 24 Fe Rx STD panel drawn Claritin Rx   Education reviewed: low fat, low cholesterol diet, safe sex/STD prevention, self breast exams and weight bearing exercise.  Calcium, magnesium and D3 supplements recommended.  Meds ordered this encounter  Medications  . Prenatal Vit-Fe Fumarate-FA (PREPLUS) 27-1 MG TABS    Sig: TAKE ONE TABLET BY MOUTH ONCE DAILY BEFORE BREAKFAST    Dispense:  90 tablet    Refill:  3  . loratadine (CLARITIN) 10 MG tablet    Sig: Take 1 tablet (10 mg total) by mouth daily.    Dispense:  30 tablet    Refill:  11  . DISCONTD: Norethin Ace-Eth Estrad-FE 1-20 MG-MCG(24) CHEW    Sig: Chew 1 tablet by mouth daily before breakfast.    Dispense:  28 tablet    Refill:  11  . MINASTRIN 24 FE 1-20 MG-MCG(24) CHEW    Sig: Chew 1 tablet by mouth daily before breakfast.    Dispense:  28 tablet    Refill:  11   Orders Placed This Encounter  Procedures  . SureSwab, Vaginosis/Vaginitis Plus  . Comprehensive metabolic panel  . TSH  . CBC  . HIV antibody  . Hepatitis B surface antigen  . RPR  . Hepatitis C antibody

## 2014-12-02 LAB — HEPATITIS C ANTIBODY: HCV Ab: NEGATIVE

## 2014-12-02 LAB — HEPATITIS B SURFACE ANTIGEN: Hepatitis B Surface Ag: NEGATIVE

## 2014-12-02 LAB — RPR

## 2014-12-02 LAB — HIV ANTIBODY (ROUTINE TESTING W REFLEX): HIV: NONREACTIVE

## 2014-12-03 LAB — PAP IG AND HPV HIGH-RISK: HPV DNA HIGH RISK: NOT DETECTED

## 2014-12-04 LAB — SURESWAB, VAGINOSIS/VAGINITIS PLUS
ATOPOBIUM VAGINAE: NOT DETECTED Log (cells/mL)
C. GLABRATA, DNA: NOT DETECTED
C. PARAPSILOSIS, DNA: NOT DETECTED
C. albicans, DNA: NOT DETECTED
C. trachomatis RNA, TMA: NOT DETECTED
C. tropicalis, DNA: NOT DETECTED
GARDNERELLA VAGINALIS: NOT DETECTED Log (cells/mL)
LACTOBACILLUS SPECIES: 8 Log (cells/mL)
MEGASPHAERA SPECIES: NOT DETECTED Log (cells/mL)
N. GONORRHOEAE RNA, TMA: NOT DETECTED
T. vaginalis RNA, QL TMA: NOT DETECTED

## 2015-02-23 ENCOUNTER — Other Ambulatory Visit: Payer: Self-pay | Admitting: Obstetrics

## 2015-09-07 ENCOUNTER — Other Ambulatory Visit: Payer: Self-pay | Admitting: Obstetrics

## 2015-09-07 NOTE — Telephone Encounter (Signed)
Please review for refill.  

## 2015-12-06 ENCOUNTER — Ambulatory Visit: Payer: BLUE CROSS/BLUE SHIELD | Admitting: Obstetrics

## 2016-01-08 ENCOUNTER — Other Ambulatory Visit: Payer: Self-pay | Admitting: Obstetrics

## 2016-01-08 DIAGNOSIS — Z01419 Encounter for gynecological examination (general) (routine) without abnormal findings: Secondary | ICD-10-CM

## 2016-01-19 ENCOUNTER — Ambulatory Visit: Payer: BLUE CROSS/BLUE SHIELD | Admitting: Obstetrics

## 2016-02-27 ENCOUNTER — Ambulatory Visit: Payer: BLUE CROSS/BLUE SHIELD | Admitting: Obstetrics

## 2016-05-01 ENCOUNTER — Other Ambulatory Visit: Payer: Self-pay | Admitting: Obstetrics

## 2016-05-01 ENCOUNTER — Ambulatory Visit (INDEPENDENT_AMBULATORY_CARE_PROVIDER_SITE_OTHER): Payer: BC Managed Care – PPO | Admitting: Obstetrics

## 2016-05-01 ENCOUNTER — Encounter: Payer: Self-pay | Admitting: Obstetrics

## 2016-05-01 VITALS — BP 103/71 | HR 82 | Temp 98.3°F | Wt 163.2 lb

## 2016-05-01 DIAGNOSIS — Z1231 Encounter for screening mammogram for malignant neoplasm of breast: Secondary | ICD-10-CM

## 2016-05-01 DIAGNOSIS — Z113 Encounter for screening for infections with a predominantly sexual mode of transmission: Secondary | ICD-10-CM

## 2016-05-01 DIAGNOSIS — N644 Mastodynia: Secondary | ICD-10-CM | POA: Diagnosis not present

## 2016-05-01 DIAGNOSIS — Z3041 Encounter for surveillance of contraceptive pills: Secondary | ICD-10-CM

## 2016-05-01 DIAGNOSIS — Z01419 Encounter for gynecological examination (general) (routine) without abnormal findings: Secondary | ICD-10-CM | POA: Diagnosis not present

## 2016-05-01 DIAGNOSIS — J301 Allergic rhinitis due to pollen: Secondary | ICD-10-CM

## 2016-05-01 DIAGNOSIS — Z124 Encounter for screening for malignant neoplasm of cervix: Secondary | ICD-10-CM | POA: Diagnosis not present

## 2016-05-01 DIAGNOSIS — Z1151 Encounter for screening for human papillomavirus (HPV): Secondary | ICD-10-CM

## 2016-05-01 DIAGNOSIS — Z1239 Encounter for other screening for malignant neoplasm of breast: Secondary | ICD-10-CM

## 2016-05-01 MED ORDER — LORATADINE 10 MG PO TABS: 10.0000 mg | ORAL_TABLET | Freq: Every day | ORAL | 11 refills | Status: DC

## 2016-05-01 MED ORDER — NORETHIN ACE-ETH ESTRAD-FE 1-20 MG-MCG(24) PO CHEW: 1.0000 | CHEWABLE_TABLET | Freq: Every day | ORAL | 11 refills | Status: DC

## 2016-05-01 NOTE — Addendum Note (Signed)
Addended by: Manuela Schwartz C on: 05/01/2016 05:06 PM   Modules accepted: Orders

## 2016-05-01 NOTE — Addendum Note (Signed)
Addended by: Baltazar Najjar A on: 05/01/2016 04:44 PM   Modules accepted: Orders

## 2016-05-01 NOTE — Progress Notes (Signed)
Subjective:        Whitney Williams is a 50 y.o. female here for a routine exam.  Current complaints: None.    Personal health questionnaire:  Is patient [name], have a family history of breast and/or ovarian cancer: no Is there a family history of uterine cancer diagnosed at age < 72, gastrointestinal cancer, urinary tract cancer, family member who is a Field seismologist syndrome-associated carrier: no Is the patient overweight and hypertensive, family history of diabetes, personal history of gestational diabetes, preeclampsia or PCOS: no Is patient over 47, have PCOS,  family history of premature CHD under age 38, diabetes, smoke, have hypertension or peripheral artery disease:  no At any time, has a partner hit, kicked or otherwise hurt or frightened you?: no Over the past 2 weeks, have you felt down, depressed or hopeless?: no Over the past 2 weeks, have you felt little interest or pleasure in doing things?:no   Gynecologic History Patient's last menstrual period was 04/14/2016 (exact date). Contraception: OCP (estrogen/progesterone) Last Pap: 2016. Results were: normal Last mammogram: 2014. Results were: normal  Obstetric History OB History  No data available    History reviewed. No pertinent past medical history.  Past Surgical History:  Procedure Laterality Date  . BREAST LUMPECTOMY Right 2003 and 2004   x 2  . WRIST GANGLION EXCISION     x 5     Current Outpatient Prescriptions:  .  Prenatal Vit-Fe Fumarate-FA (PNV PRENATAL PLUS MULTIVITAMIN) 27-1 MG TABS, TAKE ONE TABLET BY MOUTH ONCE DAILY BEFORE BREAKFAST, Disp: 90 tablet, Rfl: 2 .  acetaminophen-codeine (TYLENOL #3) 300-30 MG per tablet, Take 1-2 tablets by mouth every 6 (six) hours as needed for moderate pain. (Patient not taking: Reported on 05/01/2016), Disp: 20 tablet, Rfl: 0 .  azithromycin (ZITHROMAX Z-PAK) 250 MG tablet, Use as directed (Patient not taking: Reported on 05/01/2016), Disp: 6 tablet, Rfl:  0 .  fluconazole (DIFLUCAN) 150 MG tablet, Take 1 tablet (150 mg total) by mouth once. (Patient not taking: Reported on 05/01/2016), Disp: 1 tablet, Rfl: 0 .  loratadine (CLARITIN) 10 MG tablet, Take 1 tablet (10 mg total) by mouth daily. (Patient not taking: Reported on 05/01/2016), Disp: 30 tablet, Rfl: 11 .  MINASTRIN 24 FE 1-20 MG-MCG(24) CHEW, TAKE 1 TABLET BY MOUTH DAILY BEFORE BREAKFAST (Patient not taking: Reported on 05/01/2016), Disp: 28 tablet, Rfl: 6 .  Prenatal Vit-Fe Fumarate-FA (PREPLUS) 27-1 MG TABS, TAKE ONE TABLET BY MOUTH ONCE DAILY BEFORE BREAKFAST (Patient not taking: Reported on 05/01/2016), Disp: 90 tablet, Rfl: 0 .  Prenatal Vit-Iron Carbonyl-FA (PRENATAL PLUS IRON) 29-1 MG TABS, Take 1 tablet by mouth daily before breakfast., Disp: 30 tablet, Rfl: 11 No Known Allergies  Social History  Substance Use Topics  . Smoking status: Former Smoker    Packs/day: 0.25    Types: Cigarettes    Quit date: 10/17/2007  . Smokeless tobacco: Never Used  . Alcohol use No    Family History  Problem Relation Age of Onset  . Breast cancer      maternal cousins x 2      Review of Systems  Constitutional: negative for fatigue and weight loss Respiratory: negative for cough and wheezing Cardiovascular: negative for chest pain, fatigue and palpitations Gastrointestinal: negative for abdominal pain and change in bowel habits Musculoskeletal:negative for myalgias Neurological: negative for gait problems and tremors Behavioral/Psych: negative for abusive relationship, depression Endocrine: negative for temperature intolerance    Genitourinary:negative for abnormal menstrual periods, genital  lesions, hot flashes, sexual problems and vaginal discharge Integument/breast: negative for breast lump, breast tenderness, nipple discharge and skin lesion(s)    Objective:       BP 103/71   Pulse 82   Temp 98.3 F (36.8 C) (Oral)   Wt 163 lb 3.2 oz (74 kg)   LMP 04/14/2016 (Exact Date)    BMI 28.91 kg/m  General:   alert  Skin:   no rash or abnormalities  Lungs:   clear to auscultation bilaterally  Heart:   regular rate and rhythm, S1, S2 normal, no murmur, click, rub or gallop  Breasts:   normal without suspicious masses, skin or nipple changes or axillary nodes  Abdomen:  normal findings: no organomegaly, soft, non-tender and no hernia  Pelvis:  External genitalia: normal general appearance Urinary system: urethral meatus normal and bladder without fullness, nontender Vaginal: normal without tenderness, induration or masses Cervix: normal appearance Adnexa: normal bimanual exam Uterus: anteverted and non-tender, normal size   Lab Review Urine pregnancy test Labs reviewed yes Radiologic studies reviewed yes  50% of 20 min visit spent on counseling and coordination of care.    Assessment:    Healthy female exam.    Contraceptive Counseling and Advice  Allergic Rhinitis  Mastodynia of right breast   Plan:    Education reviewed: calcium supplements, low fat, low cholesterol diet, safe sex/STD prevention, self breast exams and weight bearing exercise. Contraception: OCP (estrogen/progesterone). Mammogram ordered. Follow up in: 1 year.   No orders of the defined types were placed in this encounter.  No orders of the defined types were placed in this encounter.   Patient ID: Whitney Williams, female   DOB: 01-Nov-1965, 50 y.o.   MRN: [id number]

## 2016-05-02 LAB — CBC
Hematocrit: 38.6 % (ref 34.0–46.6)
Hemoglobin: 11.8 g/dL (ref 11.1–15.9)
MCH: 21.3 pg — ABNORMAL LOW (ref 26.6–33.0)
MCHC: 30.6 g/dL — ABNORMAL LOW (ref 31.5–35.7)
MCV: 70 fL — ABNORMAL LOW (ref 79–97)
PLATELETS: 366 10*3/uL (ref 150–379)
RBC: 5.55 x10E6/uL — AB (ref 3.77–5.28)
RDW: 17.4 % — ABNORMAL HIGH (ref 12.3–15.4)
WBC: 8.6 10*3/uL (ref 3.4–10.8)

## 2016-05-02 LAB — COMPREHENSIVE METABOLIC PANEL
ALT: 19 IU/L (ref 0–32)
AST: 19 IU/L (ref 0–40)
Albumin/Globulin Ratio: 1.5 (ref 1.2–2.2)
Albumin: 4.3 g/dL (ref 3.5–5.5)
Alkaline Phosphatase: 66 IU/L (ref 39–117)
BUN/Creatinine Ratio: 12 (ref 9–23)
BUN: 10 mg/dL (ref 6–24)
Bilirubin Total: 0.5 mg/dL (ref 0.0–1.2)
CALCIUM: 9.2 mg/dL (ref 8.7–10.2)
CO2: 22 mmol/L (ref 18–29)
CREATININE: 0.81 mg/dL (ref 0.57–1.00)
Chloride: 99 mmol/L (ref 96–106)
GFR calc Af Amer: 99 mL/min/{1.73_m2} (ref >59)
GFR, EST NON AFRICAN AMERICAN: 86 mL/min/{1.73_m2} (ref >59)
GLUCOSE: 82 mg/dL (ref 65–99)
Globulin, Total: 2.8 g/dL (ref 1.5–4.5)
POTASSIUM: 4.3 mmol/L (ref 3.5–5.2)
Sodium: 137 mmol/L (ref 134–144)
Total Protein: 7.1 g/dL (ref 6.0–8.5)

## 2016-05-02 LAB — HEPATITIS C ANTIBODY

## 2016-05-02 LAB — HIV ANTIBODY (ROUTINE TESTING W REFLEX): HIV Screen 4th Generation wRfx: NONREACTIVE

## 2016-05-02 LAB — RPR: RPR Ser Ql: NONREACTIVE

## 2016-05-02 LAB — HEPATITIS B SURFACE ANTIGEN: Hepatitis B Surface Ag: NEGATIVE

## 2016-05-02 LAB — TSH: TSH: 0.826 u[IU]/mL (ref 0.450–4.500)

## 2016-05-05 LAB — NUSWAB VG+, CANDIDA 6SP
Atopobium vaginae: HIGH Score — AB
BVAB 2: HIGH Score — AB
CANDIDA ALBICANS, NAA: NEGATIVE
CANDIDA GLABRATA, NAA: NEGATIVE
CANDIDA LUSITANIAE, NAA: NEGATIVE
CANDIDA PARAPSILOSIS, NAA: NEGATIVE
CANDIDA TROPICALIS, NAA: NEGATIVE
CHLAMYDIA TRACHOMATIS, NAA: NEGATIVE
Candida krusei, NAA: NEGATIVE
MEGASPHAERA 1: HIGH {score} — AB
Neisseria gonorrhoeae, NAA: NEGATIVE
Trich vag by NAA: NEGATIVE

## 2016-05-07 ENCOUNTER — Other Ambulatory Visit: Payer: Self-pay | Admitting: Obstetrics

## 2016-05-07 DIAGNOSIS — N760 Acute vaginitis: Principal | ICD-10-CM

## 2016-05-07 DIAGNOSIS — B9689 Other specified bacterial agents as the cause of diseases classified elsewhere: Secondary | ICD-10-CM

## 2016-05-07 MED ORDER — METRONIDAZOLE 500 MG PO TABS: 500.0000 mg | ORAL_TABLET | Freq: Two times a day (BID) | ORAL | 2 refills | Status: DC

## 2016-05-09 LAB — CYTOLOGY - PAP
Diagnosis: NEGATIVE
HPV: NOT DETECTED

## 2016-05-16 ENCOUNTER — Telehealth: Payer: Self-pay

## 2016-05-16 NOTE — Telephone Encounter (Signed)
Patient called in, advised of lab results and rx.

## 2016-05-16 NOTE — Telephone Encounter (Signed)
Attempted to contact pt and inform of lab results and rx.

## 2016-06-01 ENCOUNTER — Other Ambulatory Visit: Payer: Self-pay | Admitting: Obstetrics

## 2016-06-01 ENCOUNTER — Ambulatory Visit: Payer: BC Managed Care – PPO

## 2016-06-01 ENCOUNTER — Ambulatory Visit
Admission: RE | Admit: 2016-06-01 | Discharge: 2016-06-01 | Disposition: A | Discharge status: Home / Self Care | Payer: BC Managed Care – PPO | Source: Ambulatory Visit | Attending: Obstetrics | Admitting: Obstetrics

## 2016-06-01 DIAGNOSIS — Z1231 Encounter for screening mammogram for malignant neoplasm of breast: Secondary | ICD-10-CM

## 2017-03-24 ENCOUNTER — Other Ambulatory Visit: Payer: Self-pay | Admitting: Obstetrics

## 2017-05-06 ENCOUNTER — Encounter: Payer: Self-pay | Admitting: Obstetrics

## 2017-05-06 ENCOUNTER — Ambulatory Visit (INDEPENDENT_AMBULATORY_CARE_PROVIDER_SITE_OTHER): Payer: BC Managed Care – PPO | Admitting: Obstetrics

## 2017-05-06 VITALS — BP 120/78 | HR 79 | Ht 63.0 in | Wt 161.0 lb

## 2017-05-06 DIAGNOSIS — Z01419 Encounter for gynecological examination (general) (routine) without abnormal findings: Secondary | ICD-10-CM

## 2017-05-06 DIAGNOSIS — B3731 Acute candidiasis of vulva and vagina: Secondary | ICD-10-CM

## 2017-05-06 DIAGNOSIS — Z113 Encounter for screening for infections with a predominantly sexual mode of transmission: Secondary | ICD-10-CM

## 2017-05-06 DIAGNOSIS — Z124 Encounter for screening for malignant neoplasm of cervix: Secondary | ICD-10-CM | POA: Diagnosis not present

## 2017-05-06 DIAGNOSIS — J301 Allergic rhinitis due to pollen: Secondary | ICD-10-CM

## 2017-05-06 DIAGNOSIS — Z1151 Encounter for screening for human papillomavirus (HPV): Secondary | ICD-10-CM | POA: Diagnosis not present

## 2017-05-06 DIAGNOSIS — B373 Candidiasis of vulva and vagina: Secondary | ICD-10-CM

## 2017-05-06 DIAGNOSIS — J019 Acute sinusitis, unspecified: Secondary | ICD-10-CM

## 2017-05-06 MED ORDER — LORATADINE 10 MG PO TABS: 10.0000 mg | ORAL_TABLET | Freq: Every day | ORAL | 11 refills | Status: DC

## 2017-05-06 MED ORDER — PNV PRENATAL PLUS MULTIVITAMIN 27-1 MG PO TABS: ORAL_TABLET | ORAL | 4 refills | Status: DC

## 2017-05-06 MED ORDER — FLUCONAZOLE 150 MG PO TABS: 150.0000 mg | ORAL_TABLET | Freq: Once | ORAL | 0 refills | Status: AC

## 2017-05-06 NOTE — Progress Notes (Signed)
Patient is in the office for annual, last pap 05-01-16. Pt requests std testing.

## 2017-05-06 NOTE — Progress Notes (Signed)
Subjective:        Whitney Williams is a 51 y.o. female here for a routine exam.  Current complaints: Hair loss..    Personal health questionnaire:  Is patient [name], have a family history of breast and/or ovarian cancer: yes, Breast CA Is there a family history of uterine cancer diagnosed at age < 31, gastrointestinal cancer, urinary tract cancer, family member who is a Field seismologist syndrome-associated carrier: no Is the patient overweight and hypertensive, family history of diabetes, personal history of gestational diabetes, preeclampsia or PCOS: no Is patient over 72, have PCOS,  family history of premature CHD under age 54, diabetes, smoke, have hypertension or peripheral artery disease:  no At any time, has a partner hit, kicked or otherwise hurt or frightened you?: no Over the past 2 weeks, have you felt down, depressed or hopeless?: no Over the past 2 weeks, have you felt little interest or pleasure in doing things?:no   Gynecologic History Patient's last menstrual period was 04/15/2017. Contraception: OCP (estrogen/progesterone) Last Pap: 2017. Results were: normal Last mammogram: 2017. Results were: normal  Obstetric History OB History  Gravida Para Term Preterm AB Living  3 3 3     3   SAB TAB Ectopic Multiple Live Births          3    # Outcome Date GA Lbr Len/2nd Weight Sex Delivery Anes PTL Lv  3 Term 12/17/03    F Vag-Spont   LIV  2 Term 02/12/95    F Vag-Spont   LIV  1 Term 08/11/93    M Vag-Spont   LIV      History reviewed. No pertinent past medical history.  Past Surgical History:  Procedure Laterality Date  . BREAST LUMPECTOMY Right 2003 and 2004   x 2  . WRIST GANGLION EXCISION     x 5     Current Outpatient Medications:  .  diphenhydrAMINE (BENADRYL) 25 MG tablet, Take 25 mg by mouth every 6 (six) hours as needed., Disp: , Rfl:  .  EPINEPHrine 0.15 MG/0.15ML IJ injection, Inject 0.15 mg into the muscle as needed for anaphylaxis., Disp: ,  Rfl:  .  loratadine (CLARITIN) 10 MG tablet, Take 1 tablet (10 mg total) by mouth daily., Disp: 30 tablet, Rfl: 11 .  Norethin Ace-Eth Estrad-FE (MINASTRIN 24 FE) 1-20 MG-MCG(24) CHEW, Chew 1 tablet by mouth at bedtime., Disp: 28 tablet, Rfl: 11 .  Prenatal Vit-Fe Fumarate-FA (PNV PRENATAL PLUS MULTIVITAMIN) 27-1 MG TABS, TAKE ONE TABLET BY MOUTH ONCE DAILY BEFORE BREAKFAST, Disp: 30 tablet, Rfl: 11 .  Prenatal Vit-Fe Fumarate-FA (PNV PRENATAL PLUS MULTIVITAMIN) 27-1 MG TABS, TAKE ONE TABLET BY MOUTH ONCE DAILY BEFORE BREAKFAST, Disp: 90 tablet, Rfl: 4 .  Prenatal Vit-Iron Carbonyl-FA (PRENATAL PLUS IRON) 29-1 MG TABS, Take 1 tablet by mouth daily before breakfast., Disp: 30 tablet, Rfl: 11 .  acetaminophen-codeine (TYLENOL #3) 300-30 MG per tablet, Take 1-2 tablets by mouth every 6 (six) hours as needed for moderate pain. (Patient not taking: Reported on 05/01/2016), Disp: 20 tablet, Rfl: 0 .  azithromycin (ZITHROMAX Z-PAK) 250 MG tablet, Use as directed (Patient not taking: Reported on 05/01/2016), Disp: 6 tablet, Rfl: 0 .  fluconazole (DIFLUCAN) 150 MG tablet, Take 1 tablet (150 mg total) by mouth once for 1 dose., Disp: 1 tablet, Rfl: 0 .  metroNIDAZOLE (FLAGYL) 500 MG tablet, Take 1 tablet (500 mg total) by mouth 2 (two) times daily. (Patient not taking: Reported on 05/06/2017), Disp: 14  tablet, Rfl: 2 .  MINASTRIN 24 FE 1-20 MG-MCG(24) CHEW, TAKE 1 TABLET BY MOUTH DAILY BEFORE BREAKFAST (Patient not taking: Reported on 05/01/2016), Disp: 28 tablet, Rfl: 6 No Known Allergies  Social History   Tobacco Use  . Smoking status: Former Smoker    Packs/day: 0.25    Types: Cigarettes    Last attempt to quit: 10/17/2007    Years since quitting: 9.5  . Smokeless tobacco: Never Used  Substance Use Topics  . Alcohol use: No    Family History  Problem Relation Age of Onset  . Breast cancer Unknown        maternal cousins x 2      Review of Systems  Constitutional: negative for fatigue and  weight loss Respiratory: negative for cough and wheezing Cardiovascular: negative for chest pain, fatigue and palpitations Gastrointestinal: negative for abdominal pain and change in bowel habits Musculoskeletal:negative for myalgias Neurological: negative for gait problems and tremors Behavioral/Psych: negative for abusive relationship, depression Endocrine: negative for temperature intolerance    Genitourinary:negative for abnormal menstrual periods, genital lesions, hot flashes, sexual problems and vaginal discharge Integument/breast: negative for breast lump, breast tenderness, nipple discharge and skin lesion(s)    Objective:       BP 120/78   Pulse 79   Ht 5\' 3"  (1.6 m)   Wt 161 lb (73 kg)   LMP 04/15/2017   BMI 28.52 kg/m  General:   alert  Skin:   no rash or abnormalities  Lungs:   clear to auscultation bilaterally  Heart:   regular rate and rhythm, S1, S2 normal, no murmur, click, rub or gallop  Breasts:   normal without suspicious masses, skin or nipple changes or axillary nodes  Abdomen:  normal findings: no organomegaly, soft, non-tender and no hernia  Pelvis:  External genitalia: normal general appearance Urinary system: urethral meatus normal and bladder without fullness, nontender Vaginal: normal without tenderness, induration or masses Cervix: normal appearance Adnexa: normal bimanual exam Uterus: anteverted and non-tender, normal size   Lab Review Urine pregnancy test Labs reviewed yes Radiologic studies reviewed yes  50% of 20 min visit spent on counseling and coordination of care.    Assessment:     1. Encounter for gynecological examination with Papanicolaou smear of cervix Rx: - Cytology - PAP - Cervicovaginal ancillary only - Prenatal Vit-Fe Fumarate-FA (PNV PRENATAL PLUS MULTIVITAMIN) 27-1 MG TABS; TAKE ONE TABLET BY MOUTH ONCE DAILY BEFORE BREAKFAST  Dispense: 90 tablet; Refill: 4  2. Acute sinusitis, recurrence not specified, unspecified  location - on antibiotic   3. Chronic seasonal allergic rhinitis due to pollen Rx: - EPINEPHrine 0.15 MG/0.15ML IJ injection; Inject 0.15 mg into the muscle as needed for anaphylaxis. - diphenhydrAMINE (BENADRYL) 25 MG tablet; Take 25 mg by mouth every 6 (six) hours as needed. - loratadine (CLARITIN) 10 MG tablet; Take 1 tablet (10 mg total) by mouth daily.  Dispense: 30 tablet; Refill: 11  4. Screen for STD (sexually transmitted disease) Rx: - HIV antibody (with reflex) - RPR - Hepatitis B Surface AntiGEN - Hepatitis C Antibody  5. Candida vaginitis Rx: - fluconazole (DIFLUCAN) 150 MG tablet; Take 1 tablet (150 mg total) by mouth once for 1 dose.  Dispense: 1 tablet; Refill: 0   Plan:    Education reviewed: calcium supplements, depression evaluation, low fat, low cholesterol diet, safe sex/STD prevention, self breast exams and weight bearing exercise. Contraception: OCP (estrogen/progesterone). Follow up in: 1 year.   Meds ordered this encounter  Medications  . fluconazole (DIFLUCAN) 150 MG tablet    Sig: Take 1 tablet (150 mg total) by mouth once for 1 dose.    Dispense:  1 tablet    Refill:  0  . Prenatal Vit-Fe Fumarate-FA (PNV PRENATAL PLUS MULTIVITAMIN) 27-1 MG TABS    Sig: TAKE ONE TABLET BY MOUTH ONCE DAILY BEFORE BREAKFAST    Dispense:  90 tablet    Refill:  4  . loratadine (CLARITIN) 10 MG tablet    Sig: Take 1 tablet (10 mg total) by mouth daily.    Dispense:  30 tablet    Refill:  11   Orders Placed This Encounter  Procedures  . HIV antibody (with reflex)  . RPR  . Hepatitis B Surface AntiGEN  . Hepatitis C Antibody

## 2017-05-07 LAB — HEPATITIS C ANTIBODY

## 2017-05-07 LAB — HIV ANTIBODY (ROUTINE TESTING W REFLEX): HIV SCREEN 4TH GENERATION: NONREACTIVE

## 2017-05-07 LAB — RPR: RPR: NONREACTIVE

## 2017-05-07 LAB — HEPATITIS B SURFACE ANTIGEN: Hepatitis B Surface Ag: NEGATIVE

## 2017-05-08 LAB — CERVICOVAGINAL ANCILLARY ONLY
Bacterial vaginitis: NEGATIVE
Candida vaginitis: NEGATIVE
Chlamydia: NEGATIVE
NEISSERIA GONORRHEA: NEGATIVE
TRICH (WINDOWPATH): NEGATIVE

## 2017-05-08 LAB — CYTOLOGY - PAP
ADEQUACY: ABSENT
DIAGNOSIS: NEGATIVE
HPV: NOT DETECTED

## 2017-05-21 ENCOUNTER — Other Ambulatory Visit: Payer: Self-pay | Admitting: Obstetrics

## 2017-05-22 ENCOUNTER — Other Ambulatory Visit: Payer: Self-pay

## 2017-05-22 ENCOUNTER — Telehealth: Payer: Self-pay

## 2017-05-24 ENCOUNTER — Other Ambulatory Visit: Payer: Self-pay

## 2017-05-24 ENCOUNTER — Other Ambulatory Visit: Payer: Self-pay | Admitting: Obstetrics

## 2017-05-24 DIAGNOSIS — N644 Mastodynia: Secondary | ICD-10-CM

## 2017-06-05 ENCOUNTER — Ambulatory Visit
Admission: RE | Admit: 2017-06-05 | Discharge: 2017-06-05 | Disposition: A | Discharge status: Home / Self Care | Payer: BC Managed Care – PPO | Source: Ambulatory Visit | Attending: Obstetrics | Admitting: Obstetrics

## 2017-06-05 ENCOUNTER — Ambulatory Visit: Payer: Self-pay

## 2017-06-05 DIAGNOSIS — N644 Mastodynia: Secondary | ICD-10-CM

## 2017-08-07 NOTE — Telephone Encounter (Signed)
ERROR

## 2018-01-14 ENCOUNTER — Telehealth: Payer: Self-pay

## 2018-01-14 NOTE — Telephone Encounter (Signed)
Pt states that she is having vaginal discharge, and has noticed a bump on her labia. She states that the area is not painful, no itching, and no drainage that she is aware of. She states that she wears a pad everyday, and is not sure if the pad has caused this bump or infection. She wanted to see if there was a sooner appointment. She is scheduled for 01/21/18 with Dr. Jodi Mourning. Pt informed that there was nothing else sooner, and to keep her appt.

## 2018-01-21 ENCOUNTER — Other Ambulatory Visit (HOSPITAL_COMMUNITY)
Admission: RE | Admit: 2018-01-21 | Discharge: 2018-01-21 | Disposition: A | Discharge status: Home / Self Care | Payer: BC Managed Care – PPO | Source: Ambulatory Visit | Attending: Obstetrics | Admitting: Obstetrics

## 2018-01-21 ENCOUNTER — Ambulatory Visit (INDEPENDENT_AMBULATORY_CARE_PROVIDER_SITE_OTHER): Payer: BC Managed Care – PPO | Admitting: Obstetrics

## 2018-01-21 ENCOUNTER — Encounter: Payer: Self-pay | Admitting: *Deleted

## 2018-01-21 ENCOUNTER — Encounter: Payer: Self-pay | Admitting: Obstetrics

## 2018-01-21 VITALS — BP 112/72 | HR 85 | Wt 157.5 lb

## 2018-01-21 DIAGNOSIS — Z3202 Encounter for pregnancy test, result negative: Secondary | ICD-10-CM | POA: Diagnosis not present

## 2018-01-21 DIAGNOSIS — N898 Other specified noninflammatory disorders of vagina: Secondary | ICD-10-CM | POA: Insufficient documentation

## 2018-01-21 DIAGNOSIS — Z113 Encounter for screening for infections with a predominantly sexual mode of transmission: Secondary | ICD-10-CM

## 2018-01-21 DIAGNOSIS — R300 Dysuria: Secondary | ICD-10-CM

## 2018-01-21 DIAGNOSIS — N939 Abnormal uterine and vaginal bleeding, unspecified: Secondary | ICD-10-CM | POA: Diagnosis not present

## 2018-01-21 DIAGNOSIS — Z0000 Encounter for general adult medical examination without abnormal findings: Secondary | ICD-10-CM

## 2018-01-21 LAB — POCT URINE PREGNANCY: Preg Test, Ur: NEGATIVE

## 2018-01-21 MED ORDER — NORETHIN ACE-ETH ESTRAD-FE 1-20 MG-MCG(24) PO CAPS: 1.0000 | ORAL_CAPSULE | Freq: Every day | ORAL | 11 refills | Status: DC

## 2018-01-21 MED ORDER — PNV PRENATAL PLUS MULTIVITAMIN 27-1 MG PO TABS: ORAL_TABLET | ORAL | 4 refills | Status: DC

## 2018-01-21 NOTE — Progress Notes (Signed)
Pt c/o 10 day cycles.  Vaginal discharge has subsided but she still requests to check for BV, yeast, GC/CT.

## 2018-01-21 NOTE — Progress Notes (Signed)
Patient ID: Whitney Williams, female   DOB: 04-12-1966, 52 y.o.   MRN: [id number]  Patient ID: Whitney Williams, female   DOB: 1966-04-11, 52 y.o.   MRN: [id number]  Chief Complaint  Patient presents with  . Vaginal Discharge    HPI Whitney Williams is a 52 y.o. female.  Heavy 10 day cycles. HPI  History reviewed. No pertinent past medical history.  Past Surgical History:  Procedure Laterality Date  . BREAST LUMPECTOMY Right 2003 and 2004   x 2  . WRIST GANGLION EXCISION     x 5    Family History  Problem Relation Age of Onset  . Breast cancer Unknown        maternal cousins x 2    Social History Social History   Tobacco Use  . Smoking status: Former Smoker    Packs/day: 0.25    Types: Cigarettes    Last attempt to quit: 10/17/2007    Years since quitting: 10.2  . Smokeless tobacco: Never Used  Substance Use Topics  . Alcohol use: No  . Drug use: No    Allergies  Allergen Reactions  . Ibuprofen     edema    Current Outpatient Medications  Medication Sig Dispense Refill  . loratadine (CLARITIN) 10 MG tablet Take 1 tablet (10 mg total) by mouth daily. 30 tablet 11  . Prenatal Vit-Fe Fumarate-FA (PNV PRENATAL PLUS MULTIVITAMIN) 27-1 MG TABS TAKE ONE TABLET BY MOUTH ONCE DAILY BEFORE BREAKFAST 30 tablet 11  . acetaminophen-codeine (TYLENOL #3) 300-30 MG per tablet Take 1-2 tablets by mouth every 6 (six) hours as needed for moderate pain. (Patient not taking: Reported on 05/01/2016) 20 tablet 0  . azithromycin (ZITHROMAX Z-PAK) 250 MG tablet Use as directed (Patient not taking: Reported on 05/01/2016) 6 tablet 0  . diphenhydrAMINE (BENADRYL) 25 MG tablet Take 25 mg by mouth every 6 (six) hours as needed.    Marland Kitchen EPINEPHrine 0.15 MG/0.15ML IJ injection Inject 0.15 mg into the muscle as needed for anaphylaxis.    Marland Kitchen metroNIDAZOLE (FLAGYL) 500 MG tablet Take 1 tablet (500 mg total) by mouth 2 (two) times daily. (Patient not taking: Reported on 05/06/2017) 14  tablet 2  . Norethin Ace-Eth Estrad-FE (TAYTULLA) 1-20 MG-MCG(24) CAPS Take 1 capsule by mouth daily. 28 capsule 11  . Prenatal Vit-Fe Fumarate-FA (PNV PRENATAL PLUS MULTIVITAMIN) 27-1 MG TABS TAKE ONE TABLET BY MOUTH ONCE DAILY BEFORE BREAKFAST 90 tablet 4   No current facility-administered medications for this visit.     Review of Systems Review of Systems Constitutional: negative for fatigue and weight loss Respiratory: negative for cough and wheezing Cardiovascular: negative for chest pain, fatigue and palpitations Gastrointestinal: negative for abdominal pain and change in bowel habits Genitourinary:POSITIVE for heavy 10 day cycles Integument/breast: negative for nipple discharge Musculoskeletal:negative for myalgias Neurological: negative for gait problems and tremors Behavioral/Psych: negative for abusive relationship, depression Endocrine: negative for temperature intolerance      Blood pressure 112/72, pulse 85, weight 157 lb 8 oz (71.4 kg).  Physical Exam Physical Exam General:   alert  Skin:   no rash or abnormalities  Lungs:   clear to auscultation bilaterally  Heart:   regular rate and rhythm, S1, S2 normal, no murmur, click, rub or gallop  Breasts:   normal without suspicious masses, skin or nipple changes or axillary nodes  Abdomen:  normal findings: no organomegaly, soft, non-tender and no hernia  Pelvis:  External genitalia: normal general appearance Urinary system: urethral  meatus normal and bladder without fullness, nontender Vaginal: normal without tenderness, induration or masses Cervix: normal appearance Adnexa: normal bimanual exam Uterus: anteverted and non-tender, normal size    50% of 20 min visit spent on counseling and coordination of care.   Data Reviewed Wet Prep Cultures  Assessment and Plan    1. Abnormal uterine bleeding (AUB) Rx: - POCT urine pregnancy - Surgical pathology - Norethin Ace-Eth Estrad-FE (TAYTULLA) 1-20 MG-MCG(24) CAPS;  Take 1 capsule by mouth daily.  Dispense: 28 capsule; Refill: 11 - US PELVIC COMPLETE WITH TRANSVAGINAL; Future - Endometrial Biopsy ( see Procedure Note )  2. Vaginal discharge Rx: - Cervicovaginal ancillary only  3. Dysuria Rx: - Urine Culture  4. Screen for STD (sexually transmitted disease) Rx: - HIV antibody - Hepatitis B surface antigen - RPR - Hepatitis C antibody  5. Routine adult health maintenance Rx: - Prenatal Vit-Fe Fumarate-FA (PNV PRENATAL PLUS MULTIVITAMIN) 27-1 MG TABS; TAKE ONE TABLET BY MOUTH ONCE DAILY BEFORE BREAKFAST  Dispense: 90 tablet; Refill: 4    Plan    Follow up in 2 weeks for results  Orders Placed This Encounter  Procedures  . Urine Culture  . US PELVIC COMPLETE WITH TRANSVAGINAL    Standing Status:   Future    Standing Expiration Date:   03/24/2019    Order Specific Question:   Reason for Exam (SYMPTOM  OR DIAGNOSIS REQUIRED)    Answer:   AUB    Order Specific Question:   Preferred imaging location?    Answer:   [facility]  . HIV antibody  . Hepatitis B surface antigen  . RPR  . Hepatitis C antibody  . POCT urine pregnancy   Meds ordered this encounter  Medications  . Prenatal Vit-Fe Fumarate-FA (PNV PRENATAL PLUS MULTIVITAMIN) 27-1 MG TABS    Sig: TAKE ONE TABLET BY MOUTH ONCE DAILY BEFORE BREAKFAST    Dispense:  90 tablet    Refill:  4  . Norethin Ace-Eth Estrad-FE (TAYTULLA) 1-20 MG-MCG(24) CAPS    Sig: Take 1 capsule by mouth daily.    Dispense:  28 capsule    Refill:  11      CHARLES A. HARPER MD 01-21-2018   Endometrial Biopsy Procedure Note  Pre-operative Diagnosis: AUB  Post-operative Diagnosis: same  Indications: abnormal uterine bleeding  Procedure Details   Urine pregnancy test was done in office and result was negative.  The risks (including infection, bleeding, pain, and uterine perforation) and benefits of the procedure were explained to the patient and Written informed consent was obtained.     The patient was placed in the dorsal lithotomy position.  Bimanual exam showed the uterus to be in the neutral position.  A Graves' speculum inserted in the vagina, and the cervix prepped with povidone iodine.  Endocervical curettage with a Kevorkian curette was not performed.   A sharp tenaculum was applied to the anterior lip of the cervix for stabilization.  A sterile uterine sound was used to sound the uterus to a depth of 7.5cm.  A Pipelle endometrial aspirator was used to sample the endometrium.  Sample was sent for pathologic examination.  Condition: Stable  Complications: None  Plan:  The patient was advised to call for any fever or for prolonged or severe pain or bleeding. She was advised to use NSAID as needed for mild to moderate pain. She was advised to avoid vaginal intercourse for 48 hours or until the bleeding has completely stopped.  Attending Physician Documentation:  I was present for or participated in the entire procedure, including opening and closing.   Shelly Bombard MD 01-21-2018

## 2018-01-22 LAB — CERVICOVAGINAL ANCILLARY ONLY
Bacterial vaginitis: NEGATIVE
Candida vaginitis: NEGATIVE
Chlamydia: NEGATIVE
Neisseria Gonorrhea: NEGATIVE
Trichomonas: NEGATIVE

## 2018-01-22 LAB — HEPATITIS C ANTIBODY: Hep C Virus Ab: <0.1 s/co ratio (ref 0.0–0.9)

## 2018-01-22 LAB — RPR: RPR: NONREACTIVE

## 2018-01-22 LAB — HEPATITIS B SURFACE ANTIGEN: Hepatitis B Surface Ag: NEGATIVE

## 2018-01-22 LAB — HIV ANTIBODY (ROUTINE TESTING W REFLEX): HIV Screen 4th Generation wRfx: NONREACTIVE

## 2018-01-23 LAB — URINE CULTURE: ORGANISM ID, BACTERIA: NO GROWTH

## 2018-01-25 ENCOUNTER — Other Ambulatory Visit: Payer: Self-pay

## 2018-01-25 ENCOUNTER — Emergency Department (HOSPITAL_BASED_OUTPATIENT_CLINIC_OR_DEPARTMENT_OTHER)
Admission: EM | Admit: 2018-01-25 | Discharge: 2018-01-26 | Disposition: A | Discharge status: Home / Self Care | Payer: BC Managed Care – PPO | Attending: Emergency Medicine | Admitting: Emergency Medicine

## 2018-01-25 ENCOUNTER — Emergency Department (HOSPITAL_BASED_OUTPATIENT_CLINIC_OR_DEPARTMENT_OTHER): Payer: BC Managed Care – PPO

## 2018-01-25 ENCOUNTER — Encounter (HOSPITAL_BASED_OUTPATIENT_CLINIC_OR_DEPARTMENT_OTHER): Payer: Self-pay | Admitting: *Deleted

## 2018-01-25 DIAGNOSIS — Z79899 Other long term (current) drug therapy: Secondary | ICD-10-CM | POA: Diagnosis not present

## 2018-01-25 DIAGNOSIS — M25552 Pain in left hip: Secondary | ICD-10-CM | POA: Diagnosis not present

## 2018-01-25 DIAGNOSIS — J45909 Unspecified asthma, uncomplicated: Secondary | ICD-10-CM | POA: Insufficient documentation

## 2018-01-25 DIAGNOSIS — Z87891 Personal history of nicotine dependence: Secondary | ICD-10-CM | POA: Diagnosis not present

## 2018-01-25 DIAGNOSIS — M5432 Sciatica, left side: Secondary | ICD-10-CM | POA: Insufficient documentation

## 2018-01-25 HISTORY — DX: Unspecified asthma, uncomplicated: J45.909

## 2018-01-25 MED ORDER — HYDROMORPHONE HCL 1 MG/ML IJ SOLN
1.0000 mg | Freq: Once | INTRAMUSCULAR | Status: AC
  Administered 2018-01-26: 1 mg via INTRAMUSCULAR
  Filled 2018-01-25: qty 1

## 2018-01-25 MED ORDER — METHOCARBAMOL 500 MG PO TABS
500.0000 mg | ORAL_TABLET | Freq: Once | ORAL | Status: AC
  Administered 2018-01-26: 500 mg via ORAL
  Filled 2018-01-25: qty 1

## 2018-01-25 MED ORDER — PREDNISONE 50 MG PO TABS
60.0000 mg | ORAL_TABLET | Freq: Once | ORAL | Status: AC
  Administered 2018-01-26: 60 mg via ORAL
  Filled 2018-01-25: qty 1

## 2018-01-25 NOTE — ED Notes (Signed)
Requested u-preg in order to proceed w/ xrays.
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
## 2018-01-26 LAB — PREGNANCY, URINE: PREG TEST UR: NEGATIVE

## 2018-01-26 MED ORDER — HYDROCODONE-ACETAMINOPHEN 5-325 MG PO TABS: 1.0000 | ORAL_TABLET | Freq: Four times a day (QID) | ORAL | 0 refills | Status: DC | PRN

## 2018-01-26 MED ORDER — METHOCARBAMOL 500 MG PO TABS: 500.0000 mg | ORAL_TABLET | Freq: Three times a day (TID) | ORAL | 0 refills | Status: DC | PRN

## 2018-01-26 MED ORDER — PREDNISONE 20 MG PO TABS: 40.0000 mg | ORAL_TABLET | Freq: Every day | ORAL | 0 refills | Status: AC

## 2018-01-26 NOTE — ED Notes (Signed)
EDP into room 

## 2018-01-26 NOTE — ED Notes (Signed)
Ambulatory with family assist to b/r, limp d/t pain.

## 2018-01-30 ENCOUNTER — Ambulatory Visit
Admission: RE | Admit: 2018-01-30 | Discharge: 2018-01-30 | Disposition: A | Discharge status: Home / Self Care | Payer: BC Managed Care – PPO | Source: Ambulatory Visit | Attending: Obstetrics | Admitting: Obstetrics

## 2018-01-30 DIAGNOSIS — N939 Abnormal uterine and vaginal bleeding, unspecified: Secondary | ICD-10-CM

## 2018-02-04 ENCOUNTER — Ambulatory Visit (INDEPENDENT_AMBULATORY_CARE_PROVIDER_SITE_OTHER): Payer: BC Managed Care – PPO | Admitting: Obstetrics

## 2018-02-04 ENCOUNTER — Encounter: Payer: Self-pay | Admitting: Obstetrics

## 2018-02-04 VITALS — BP 106/69 | HR 86 | Wt 157.8 lb

## 2018-02-04 DIAGNOSIS — N951 Menopausal and female climacteric states: Secondary | ICD-10-CM | POA: Diagnosis not present

## 2018-02-04 DIAGNOSIS — N939 Abnormal uterine and vaginal bleeding, unspecified: Secondary | ICD-10-CM

## 2018-02-04 NOTE — Progress Notes (Signed)
Patient ID: Whitney Williams, female   DOB: 06/13/1965, 52 y.o.   MRN: [id number]  Chief Complaint  Patient presents with  . Follow-up    HPI Whitney Williams is a 52 y.o. female.  Perimenopausal female with history of irregular periods.  Presents for results of ultrasound and endometrial biopsy. HPI  Past Medical History:  Diagnosis Date  . Asthma     Past Surgical History:  Procedure Laterality Date  . BREAST LUMPECTOMY Right 2003 and 2004   x 2  . WRIST GANGLION EXCISION     x 5    Family History  Problem Relation Age of Onset  . Breast cancer Unknown        maternal cousins x 2    Social History Social History   Tobacco Use  . Smoking status: Former Smoker    Packs/day: 0.25    Types: Cigarettes    Last attempt to quit: 10/17/2007    Years since quitting: 10.3  . Smokeless tobacco: Never Used  Substance Use Topics  . Alcohol use: No  . Drug use: No    Allergies  Allergen Reactions  . Ibuprofen Shortness Of Breath and Swelling    edema    Current Outpatient Medications  Medication Sig Dispense Refill  . diphenhydrAMINE (BENADRYL) 25 MG tablet Take 25 mg by mouth every 6 (six) hours as needed.    Marland Kitchen EPINEPHrine 0.15 MG/0.15ML IJ injection Inject 0.15 mg into the muscle as needed for anaphylaxis.    Marland Kitchen HYDROcodone-acetaminophen (NORCO/VICODIN) 5-325 MG tablet Take 1-2 tablets by mouth every 6 (six) hours as needed for moderate pain or severe pain. 15 tablet 0  . loratadine (CLARITIN) 10 MG tablet Take 1 tablet (10 mg total) by mouth daily. 30 tablet 11  . methocarbamol (ROBAXIN) 500 MG tablet Take 1 tablet (500 mg total) by mouth every 8 (eight) hours as needed for muscle spasms. 20 tablet 0  . Norethin Ace-Eth Estrad-FE (TAYTULLA) 1-20 MG-MCG(24) CAPS Take 1 capsule by mouth daily. 28 capsule 11  . Prenatal Vit-Fe Fumarate-FA (PNV PRENATAL PLUS MULTIVITAMIN) 27-1 MG TABS TAKE ONE TABLET BY MOUTH ONCE DAILY BEFORE BREAKFAST 30 tablet 11  . Prenatal  Vit-Fe Fumarate-FA (PNV PRENATAL PLUS MULTIVITAMIN) 27-1 MG TABS TAKE ONE TABLET BY MOUTH ONCE DAILY BEFORE BREAKFAST 90 tablet 4  . acetaminophen-codeine (TYLENOL #3) 300-30 MG per tablet Take 1-2 tablets by mouth every 6 (six) hours as needed for moderate pain. (Patient not taking: Reported on 05/01/2016) 20 tablet 0  . azithromycin (ZITHROMAX Z-PAK) 250 MG tablet Use as directed (Patient not taking: Reported on 05/01/2016) 6 tablet 0  . metroNIDAZOLE (FLAGYL) 500 MG tablet Take 1 tablet (500 mg total) by mouth 2 (two) times daily. (Patient not taking: Reported on 05/06/2017) 14 tablet 2   No current facility-administered medications for this visit.     Review of Systems Review of Systems Constitutional: negative for fatigue and weight loss Respiratory: negative for cough and wheezing Cardiovascular: negative for chest pain, fatigue and palpitations Gastrointestinal: negative for abdominal pain and change in bowel habits Genitourinary:POSITIVE for irregular periods Integument/breast: negative for nipple discharge Musculoskeletal:negative for myalgias Neurological: negative for gait problems and tremors Behavioral/Psych: negative for abusive relationship, depression Endocrine: negative for temperature intolerance      Blood pressure 106/69, pulse 86, weight 157 lb 12.8 oz (71.6 kg), last menstrual period 01/18/2018.  Physical Exam Physical Exam :  Deferred  50% of 10 min visit spent on counseling and coordination of care.   Data Reviewed Ultrasound Endometrial Biopsy US PELVIC COMPLETE WITH TRANSVAGINAL (Accession [id number]) (Order [id number])  Imaging  Date: 01/30/2018 Department: [facility] Released By: Matt Holmes Authorizing:  Shelly Bombard, MD  Exam Information   Status Exam Begun  Exam Ended   Final [99] 01/30/2018 4:01 PM 01/30/2018 5:05 PM  PACS Images   Show images for US PELVIC COMPLETE WITH TRANSVAGINAL  Study Result   CLINICAL DATA:  52 year old female with abnormal uterine bleeding. Heavy, irregular cycles.  EXAM: TRANSABDOMINAL AND TRANSVAGINAL ULTRASOUND OF PELVIS  TECHNIQUE: Both transabdominal and transvaginal ultrasound examinations of the pelvis were performed. Transabdominal technique was performed for global imaging of the pelvis including uterus, ovaries, adnexal regions, and pelvic cul-de-sac. It was necessary to proceed with endovaginal exam following the transabdominal exam to visualize the endometrium, ovaries.  COMPARISON:  None  FINDINGS: Uterus  Measurements: 9.9 x 6.2 x 7.7 centimeters. Retroflexed. Multiple small uterine fundal region fibroids suspected ranging from 2.5 centimeters to 3.5 centimeters diameter (e.g. Image 20). These appear intramural.  Endometrium  Thickness: 2 millimeters. No focal abnormality visualized (image 64).  Right ovary  Measurements: 2.1 x 1.6 x 1.8 centimeters. Normal appearance/no adnexal mass.  Left ovary  Measurements: Could not be positively identified despite trans abdominal and transvaginal imaging.  Other findings  Trace if any pelvic free fluid.  IMPRESSION: 1. Fibroid uterus.  No endometrium abnormality identified. 2. Right ovary is within normal limits. Left ovary could not be positively identified.   Electronically Signed   By: Genevie Ann M.D.   On: 01/30/2018 17:31     Assessment     1. Abnormal uterine bleeding (AUB) - Ultrasound WNL's except for 2 small intramural fibroids - Endometrial WNL's  2. Perimenopause     Plan    Follow up prn  No orders of the defined types were placed in this encounter.  No orders of the defined types were placed in this encounter.   Shelly Bombard MD 02-04-2018

## 2018-02-04 NOTE — Progress Notes (Signed)
Patient is in the office for biopsy results from 01-21-18.

## 2018-04-18 ENCOUNTER — Other Ambulatory Visit: Payer: Self-pay | Admitting: Obstetrics

## 2018-04-18 DIAGNOSIS — Z1231 Encounter for screening mammogram for malignant neoplasm of breast: Secondary | ICD-10-CM

## 2018-05-15 ENCOUNTER — Telehealth: Payer: Self-pay

## 2018-05-15 NOTE — Telephone Encounter (Signed)
Returned call pt has IMG orders for breast center, calling to get appt scheduled, advised that scheduler will give a call back, pt agreed.

## 2018-05-20 ENCOUNTER — Telehealth: Payer: Self-pay

## 2018-05-20 NOTE — Telephone Encounter (Signed)
Contacted pt to advise that Dr. Jodi Mourning wants her to come in for appointment for breast evaluation before putting in orders for breast center. No answer, left vm

## 2018-05-20 NOTE — Telephone Encounter (Signed)
Pt returned call, advised that provider wants her to come in for appt, pt was unhappy with the decision, states that when she comes she wants the provider to giver her a referral for double mastectomy because she is stressed and tired of breast pain.

## 2018-05-23 ENCOUNTER — Ambulatory Visit: Payer: BC Managed Care – PPO | Admitting: Obstetrics

## 2018-05-29 ENCOUNTER — Ambulatory Visit: Payer: BC Managed Care – PPO | Admitting: Obstetrics

## 2018-06-06 ENCOUNTER — Ambulatory Visit: Payer: BC Managed Care – PPO

## 2018-06-10 ENCOUNTER — Ambulatory Visit
Admission: RE | Admit: 2018-06-10 | Discharge: 2018-06-10 | Disposition: A | Discharge status: Home / Self Care | Payer: BC Managed Care – PPO | Source: Ambulatory Visit | Attending: Obstetrics | Admitting: Obstetrics

## 2018-06-10 DIAGNOSIS — Z1231 Encounter for screening mammogram for malignant neoplasm of breast: Secondary | ICD-10-CM

## 2018-08-21 ENCOUNTER — Encounter: Payer: Self-pay | Admitting: Obstetrics

## 2018-08-21 ENCOUNTER — Other Ambulatory Visit: Payer: Self-pay

## 2018-08-21 ENCOUNTER — Ambulatory Visit (INDEPENDENT_AMBULATORY_CARE_PROVIDER_SITE_OTHER): Payer: BC Managed Care – PPO | Admitting: Obstetrics

## 2018-08-21 VITALS — BP 128/76 | HR 80 | Wt 164.0 lb

## 2018-08-21 DIAGNOSIS — Z124 Encounter for screening for malignant neoplasm of cervix: Secondary | ICD-10-CM | POA: Diagnosis not present

## 2018-08-21 DIAGNOSIS — N951 Menopausal and female climacteric states: Secondary | ICD-10-CM

## 2018-08-21 DIAGNOSIS — Z113 Encounter for screening for infections with a predominantly sexual mode of transmission: Secondary | ICD-10-CM

## 2018-08-21 DIAGNOSIS — Z1151 Encounter for screening for human papillomavirus (HPV): Secondary | ICD-10-CM

## 2018-08-21 DIAGNOSIS — Z01419 Encounter for gynecological examination (general) (routine) without abnormal findings: Secondary | ICD-10-CM | POA: Diagnosis not present

## 2018-08-21 DIAGNOSIS — N898 Other specified noninflammatory disorders of vagina: Secondary | ICD-10-CM | POA: Diagnosis not present

## 2018-08-21 DIAGNOSIS — N939 Abnormal uterine and vaginal bleeding, unspecified: Secondary | ICD-10-CM

## 2018-08-21 DIAGNOSIS — Z3041 Encounter for surveillance of contraceptive pills: Secondary | ICD-10-CM

## 2018-08-21 DIAGNOSIS — J301 Allergic rhinitis due to pollen: Secondary | ICD-10-CM

## 2018-08-21 DIAGNOSIS — Z0000 Encounter for general adult medical examination without abnormal findings: Secondary | ICD-10-CM

## 2018-08-21 MED ORDER — NORETHIN ACE-ETH ESTRAD-FE 1-20 MG-MCG(24) PO CAPS: 1.0000 | ORAL_CAPSULE | Freq: Every day | ORAL | 11 refills | Status: DC

## 2018-08-21 MED ORDER — FLUCONAZOLE 150 MG PO TABS: 150.0000 mg | ORAL_TABLET | Freq: Once | ORAL | 2 refills | Status: AC

## 2018-08-21 MED ORDER — LORATADINE 10 MG PO TABS: 10.0000 mg | ORAL_TABLET | Freq: Every day | ORAL | 11 refills | Status: DC

## 2018-08-21 MED ORDER — PNV PRENATAL PLUS MULTIVITAMIN 27-1 MG PO TABS: ORAL_TABLET | ORAL | 4 refills | Status: DC

## 2018-08-21 MED ORDER — PNV PRENATAL PLUS MULTIVITAMIN 27-1 MG PO TABS: ORAL_TABLET | ORAL | 11 refills | Status: DC

## 2018-08-21 NOTE — Progress Notes (Signed)
Late entry - Epic was down Pt presents for annual, pap, and all STD testing. Had two periods, Jan 16th and again 30th  Possible yeast infection, recent abx use. MGM 05/2018

## 2018-08-21 NOTE — Progress Notes (Signed)
Subjective:        Whitney Williams is a 53 y.o. female here for a routine exam.  Current complaints: None.    Personal health questionnaire:  Is patient [name], have a family history of breast and/or ovarian cancer: no Is there a family history of uterine cancer diagnosed at age < 41, gastrointestinal cancer, urinary tract cancer, family member who is a Field seismologist syndrome-associated carrier: no Is the patient overweight and hypertensive, family history of diabetes, personal history of gestational diabetes, preeclampsia or PCOS: no Is patient over 37, have PCOS,  family history of premature CHD under age 46, diabetes, smoke, have hypertension or peripheral artery disease:  no At any time, has a partner hit, kicked or otherwise hurt or frightened you?: no Over the past 2 weeks, have you felt down, depressed or hopeless?: no Over the past 2 weeks, have you felt little interest or pleasure in doing things?:no   Gynecologic History Patient's last menstrual period was 07/10/2018. Contraception: OCP (estrogen/progesterone) Last Pap: 2018. Results were: normal Last mammogram: 2019. Results were: normal  Obstetric History OB History  Gravida Para Term Preterm AB Living  3 3 3     3   SAB TAB Ectopic Multiple Live Births          3    # Outcome Date GA Lbr Len/2nd Weight Sex Delivery Anes PTL Lv  3 Term 12/17/03    F Vag-Spont   LIV  2 Term 02/12/95    F Vag-Spont   LIV  1 Term 08/11/93    Whitney Williams   LIV    Past Medical History:  Diagnosis Date  . Asthma     Past Surgical History:  Procedure Laterality Date  . BREAST LUMPECTOMY Right 2003 and 2004   x 2  . WRIST GANGLION EXCISION     x 5     Current Outpatient Medications:  .  acetaminophen-codeine (TYLENOL #3) 300-30 MG per tablet, Take 1-2 tablets by mouth every 6 (six) hours as needed for moderate pain. (Patient not taking: Reported on 05/01/2016), Disp: 20 tablet, Rfl: 0 .  azithromycin (ZITHROMAX Z-PAK)  250 MG tablet, Use as directed (Patient not taking: Reported on 05/01/2016), Disp: 6 tablet, Rfl: 0 .  diphenhydrAMINE (BENADRYL) 25 MG tablet, Take 25 mg by mouth every 6 (six) hours as needed., Disp: , Rfl:  .  EPINEPHrine 0.15 MG/0.15ML IJ injection, Inject 0.15 mg into the muscle as needed for anaphylaxis., Disp: , Rfl:  .  HYDROcodone-acetaminophen (NORCO/VICODIN) 5-325 MG tablet, Take 1-2 tablets by mouth every 6 (six) hours as needed for moderate pain or severe pain., Disp: 15 tablet, Rfl: 0 .  loratadine (CLARITIN) 10 MG tablet, Take 1 tablet (10 mg total) by mouth daily., Disp: 30 tablet, Rfl: 11 .  methocarbamol (ROBAXIN) 500 MG tablet, Take 1 tablet (500 mg total) by mouth every 8 (eight) hours as needed for muscle spasms., Disp: 20 tablet, Rfl: 0 .  metroNIDAZOLE (FLAGYL) 500 MG tablet, Take 1 tablet (500 mg total) by mouth 2 (two) times daily. (Patient not taking: Reported on 05/06/2017), Disp: 14 tablet, Rfl: 2 .  Norethin Ace-Eth Estrad-FE (TAYTULLA) 1-20 MG-MCG(24) CAPS, Take 1 capsule by mouth daily., Disp: 28 capsule, Rfl: 11 .  Prenatal Vit-Fe Fumarate-FA (PNV PRENATAL PLUS MULTIVITAMIN) 27-1 MG TABS, TAKE ONE TABLET BY MOUTH ONCE DAILY BEFORE BREAKFAST, Disp: 30 tablet, Rfl: 11 .  Prenatal Vit-Fe Fumarate-FA (PNV PRENATAL PLUS MULTIVITAMIN) 27-1 MG TABS, TAKE ONE TABLET BY  MOUTH ONCE DAILY BEFORE BREAKFAST, Disp: 90 tablet, Rfl: 4 Allergies  Allergen Reactions  . Ibuprofen Shortness Of Breath and Swelling    edema    Social History   Tobacco Use  . Smoking status: Former Smoker    Packs/day: 0.25    Types: Cigarettes    Last attempt to quit: 10/17/2007    Years since quitting: 10.8  . Smokeless tobacco: Never Used  Substance Use Topics  . Alcohol use: No    Family History  Problem Relation Age of Onset  . Breast cancer Other        maternal cousins x 2      Review of Systems  Constitutional: negative for fatigue and weight loss Respiratory: negative for cough  and wheezing Cardiovascular: negative for chest pain, fatigue and palpitations Gastrointestinal: negative for abdominal pain and change in bowel habits Musculoskeletal:negative for myalgias Neurological: negative for gait problems and tremors Behavioral/Psych: negative for abusive relationship, depression Endocrine: negative for temperature intolerance    Genitourinary:negative for abnormal menstrual periods, genital lesions, hot flashes, sexual problems and vaginal discharge Integument/breast: negative for breast lump, breast tenderness, nipple discharge and skin lesion(s)    Objective:       BP 128/76   Pulse 80   Wt 164 lb (74.4 kg)   LMP 07/10/2018   BMI 29.05 kg/m  General:   alert  Skin:   no rash or abnormalities  Lungs:   clear to auscultation bilaterally  Heart:   regular rate and rhythm, S1, S2 normal, no murmur, click, rub or gallop  Breasts:   normal without suspicious masses, skin or nipple changes or axillary nodes  Abdomen:  normal findings: no organomegaly, soft, non-tender and no hernia  Pelvis:  External genitalia: normal general appearance Urinary system: urethral meatus normal and bladder without fullness, nontender Vaginal: normal without tenderness, induration or masses Cervix: normal appearance Adnexa: normal bimanual exam Uterus: anteverted and non-tender, normal size   Lab Review Urine pregnancy test Labs reviewed yes Radiologic studies reviewed yes  50% of 20 min visit spent on counseling and coordination of care.   Assessment:     1. Encounter for routine gynecological examination with Papanicolaou smear of cervix Rx: - Cytology - PAP( [facility])  2. Perimenopause  3. Vaginal discharge Rx: - Cervicovaginal ancillary only( [facility])  4. Screen for STD (sexually transmitted disease) Rx: - Hepatitis B surface antigen - Hepatitis C antibody - HIV Antibody (routine testing w rflx) - RPR  5. Encounter for surveillance of  contraceptive pills - doing well.  Perimenopausal.  6. Abnormal uterine bleeding (AUB) Rx: - Norethin Ace-Eth Estrad-FE (TAYTULLA) 1-20 MG-MCG(24) CAPS; Take 1 capsule by mouth daily.  Dispense: 28 capsule; Refill: 11  7. Routine adult health maintenance Rx: - Prenatal Vit-Fe Fumarate-FA (PNV PRENATAL PLUS MULTIVITAMIN) 27-1 MG TABS; TAKE ONE TABLET BY MOUTH ONCE DAILY BEFORE BREAKFAST  Dispense: 90 tablet; Refill: 4  8. Chronic seasonal allergic rhinitis due to pollen Rx: - loratadine (CLARITIN) 10 MG tablet; Take 1 tablet (10 mg total) by mouth daily.  Dispense: 30 tablet; Refill: 11    Plan:    Education reviewed: calcium supplements, depression evaluation, low fat, low cholesterol diet, safe sex/STD prevention, self breast exams and weight bearing exercise. Contraception: OCP (estrogen/progesterone). Follow up in: 1 year.   Meds ordered this encounter  Medications  . Norethin Ace-Eth Estrad-FE (TAYTULLA) 1-20 MG-MCG(24) CAPS    Sig: Take 1 capsule by mouth daily.    Dispense:  28 capsule    Refill:  11  . Prenatal Vit-Fe Fumarate-FA (PNV PRENATAL PLUS MULTIVITAMIN) 27-1 MG TABS    Sig: TAKE ONE TABLET BY MOUTH ONCE DAILY BEFORE BREAKFAST    Dispense:  30 tablet    Refill:  11  . Prenatal Vit-Fe Fumarate-FA (PNV PRENATAL PLUS MULTIVITAMIN) 27-1 MG TABS    Sig: TAKE ONE TABLET BY MOUTH ONCE DAILY BEFORE BREAKFAST    Dispense:  90 tablet    Refill:  4  . loratadine (CLARITIN) 10 MG tablet    Sig: Take 1 tablet (10 mg total) by mouth daily.    Dispense:  30 tablet    Refill:  11   Orders Placed This Encounter  Procedures  . Hepatitis B surface antigen  . Hepatitis C antibody  . HIV Antibody (routine testing w rflx)  . RPR    Shelly Bombard MD 08-21-2018

## 2018-08-22 LAB — HIV ANTIBODY (ROUTINE TESTING W REFLEX): HIV SCREEN 4TH GENERATION: NONREACTIVE

## 2018-08-22 LAB — CERVICOVAGINAL ANCILLARY ONLY
Bacterial vaginitis: NEGATIVE
CANDIDA VAGINITIS: NEGATIVE
CHLAMYDIA, DNA PROBE: NEGATIVE
NEISSERIA GONORRHEA: NEGATIVE
Trichomonas: NEGATIVE

## 2018-08-22 LAB — HEPATITIS C ANTIBODY: Hep C Virus Ab: <0.1 s/co ratio (ref 0.0–0.9)

## 2018-08-22 LAB — HEPATITIS B SURFACE ANTIGEN: Hepatitis B Surface Ag: NEGATIVE

## 2018-08-22 LAB — RPR: RPR: NONREACTIVE

## 2018-08-26 LAB — CYTOLOGY - PAP
ADEQUACY: ABSENT
Diagnosis: NEGATIVE
HPV (WINDOPATH): NOT DETECTED

## 2018-10-06 IMAGING — US US PELVIS COMPLETE TRANSABD/TRANSVAG
1 series · 13 of 25 positions shown · non-contrast
Comparison: None

CLINICAL DATA: 52-year-old female with abnormal uterine bleeding.
Heavy, irregular cycles.

EXAM:
TRANSABDOMINAL AND TRANSVAGINAL ULTRASOUND OF PELVIS
TECHNIQUE: Both transabdominal and transvaginal ultrasound examinations of the
pelvis were performed. Transabdominal technique was performed for
global imaging of the pelvis including uterus, ovaries, adnexal
regions, and pelvic cul-de-sac. It was necessary to proceed with
endovaginal exam following the transabdominal exam to visualize the
endometrium, ovaries.

[Series 1: us pelvis complete transabd/transvag · 0.25mm/px · 91 acquisitions, 13 frames shown]
[im 1/91]
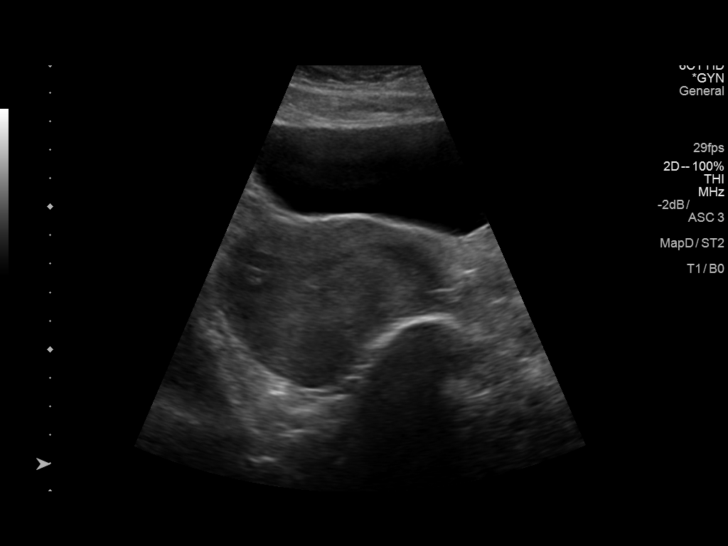
[im 8/91]
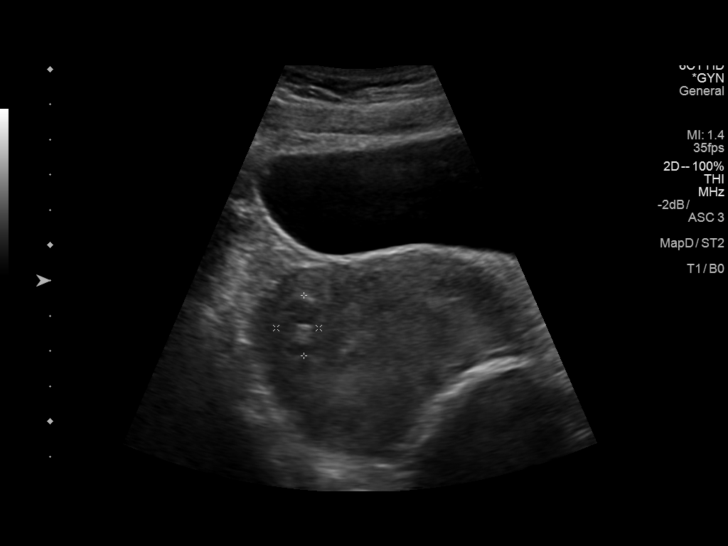
[im 16/91]
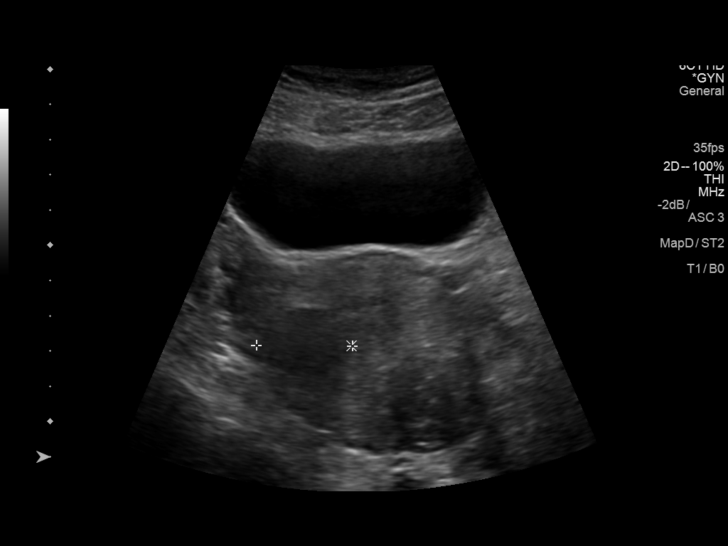
[im 23/91]
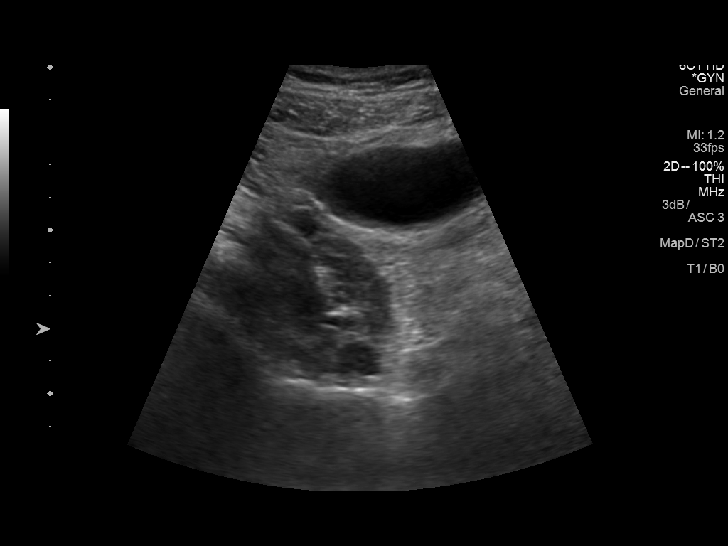
[im 31/91]
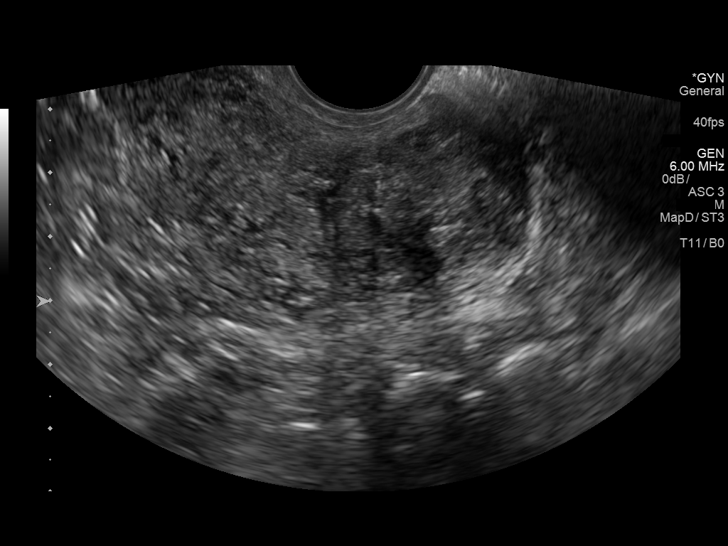
[im 38/91]
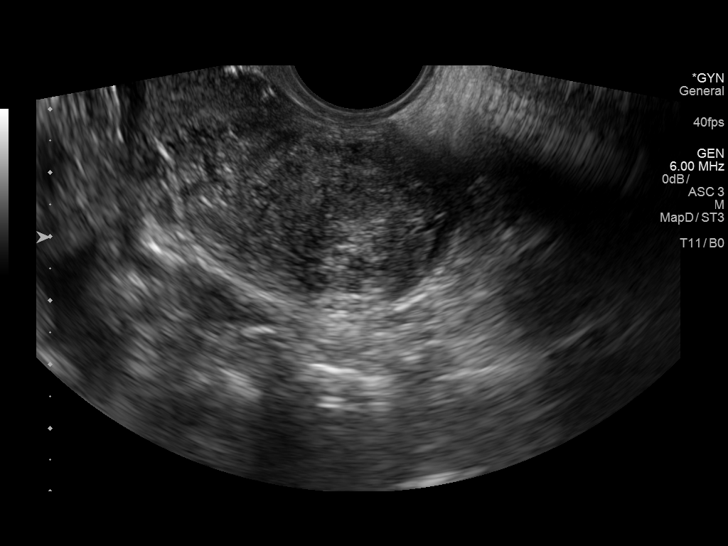
[im 46/91]
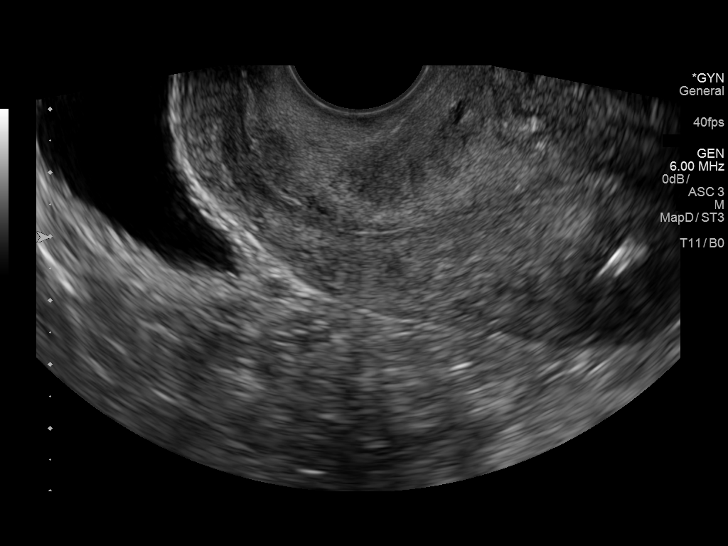
[im 53/91]
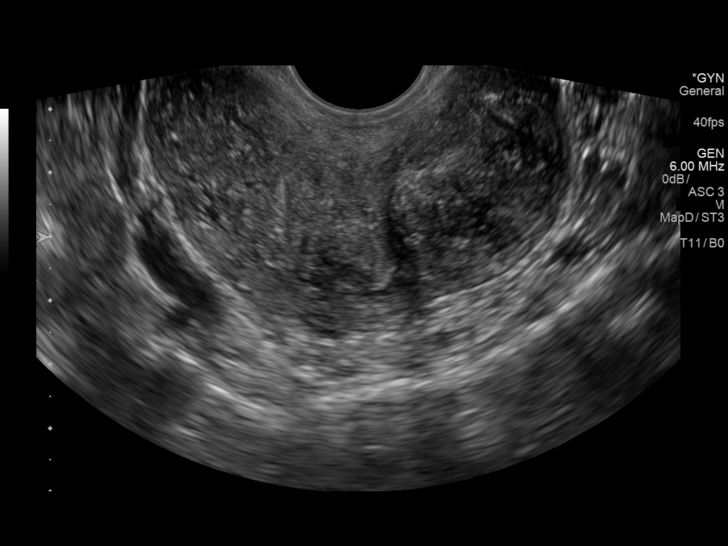
[im 61/91]
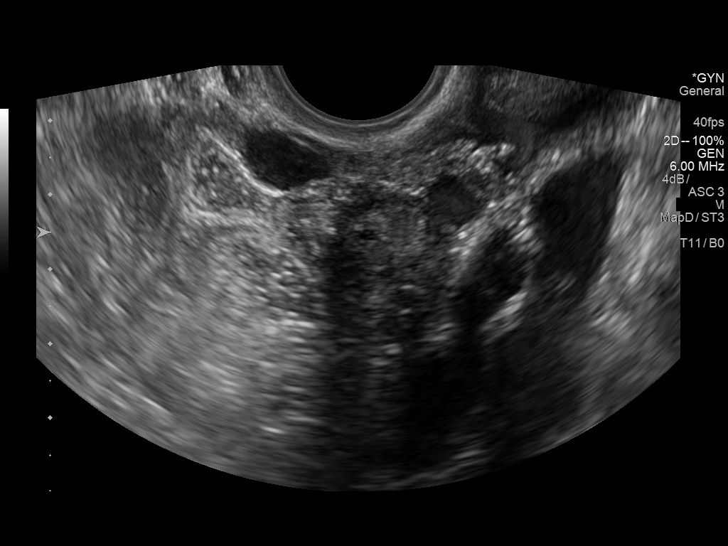
[im 68/91]
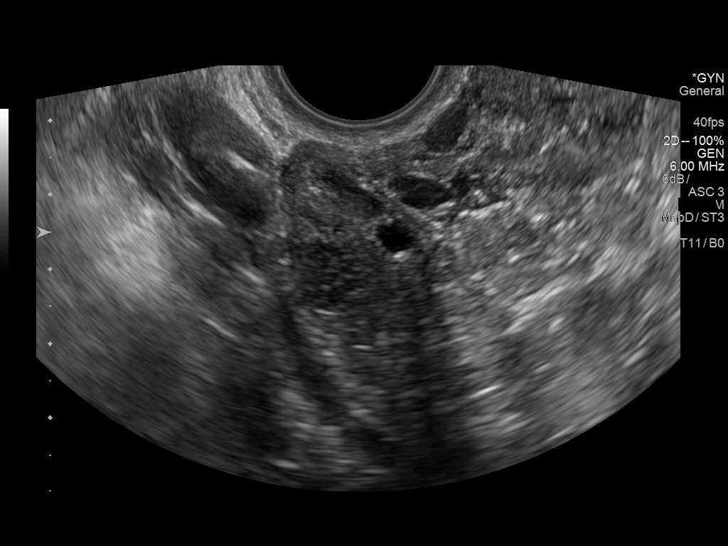
[im 76/91]
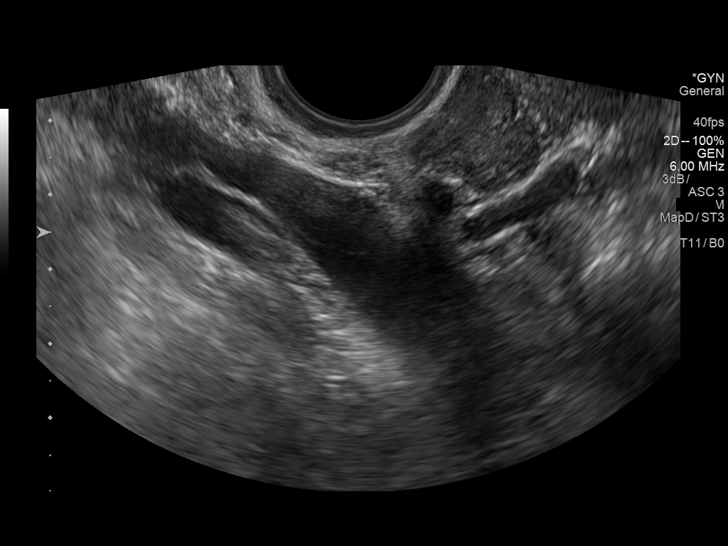
[im 83/91]
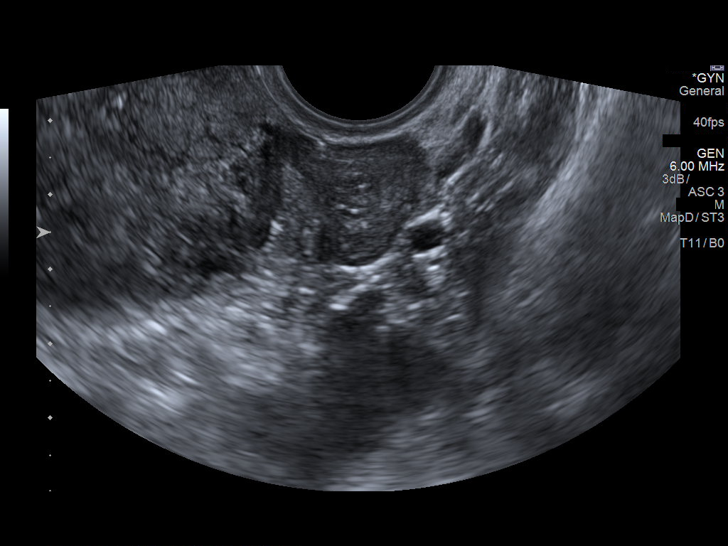
[im 91/91]
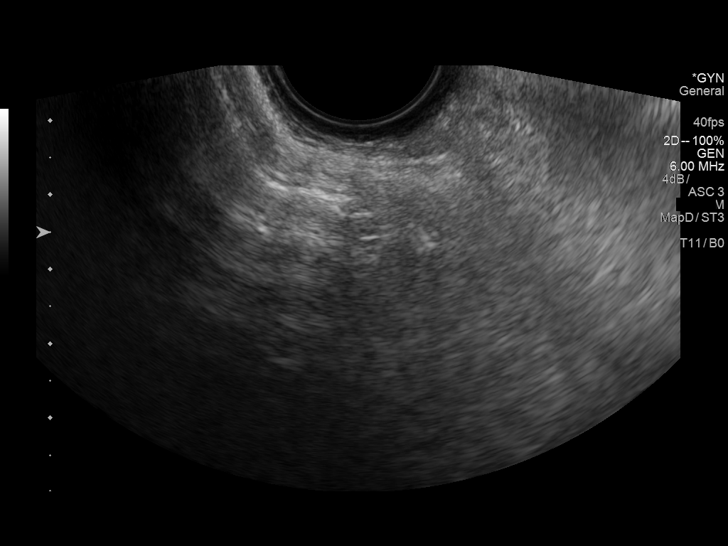

[13 of 25 positions shown; findings below may reference images not displayed]

FINDINGS: Uterus

Measurements: 9.9 x 6.2 x 7.7 centimeters. Retroflexed. Multiple
small uterine fundal region fibroids suspected ranging from
centimeters to 3.5 centimeters diameter (e.g. Image 20). These
appear intramural.

Endometrium

Thickness: 2 millimeters. No focal abnormality visualized (image
64).

Right ovary

Measurements: 2.1 x 1.6 x 1.8 centimeters. Normal appearance/no
adnexal mass.

Left ovary

Measurements: Could not be positively identified despite trans
abdominal and transvaginal imaging.

Other findings

Trace if any pelvic free fluid.
IMPRESSION: 1. Fibroid uterus.  No endometrium abnormality identified.
2. Right ovary is within normal limits. Left ovary could not be
positively identified.

## 2019-01-15 ENCOUNTER — Telehealth (INDEPENDENT_AMBULATORY_CARE_PROVIDER_SITE_OTHER): Payer: BC Managed Care – PPO | Admitting: Obstetrics

## 2019-01-15 ENCOUNTER — Encounter: Payer: Self-pay | Admitting: Obstetrics

## 2019-01-15 DIAGNOSIS — N939 Abnormal uterine and vaginal bleeding, unspecified: Secondary | ICD-10-CM | POA: Diagnosis not present

## 2019-01-15 DIAGNOSIS — N951 Menopausal and female climacteric states: Secondary | ICD-10-CM

## 2019-01-15 DIAGNOSIS — D251 Intramural leiomyoma of uterus: Secondary | ICD-10-CM

## 2019-01-15 MED ORDER — TAYTULLA 1-20 MG-MCG(24) PO CAPS
1.0000 | ORAL_CAPSULE | Freq: Every day | ORAL | 11 refills | Status: DC
Start: 2019-01-15 — End: 2023-01-31

## 2019-01-15 NOTE — Progress Notes (Signed)
S/w pt to prepare for virtual visit. Pt complains of heavy cycles, LMP 01-02-19 and lasted until 01-11-19. Pt states that she did not pick up Taytula BC pills that were prescribed at last visit in March, advised that provider sent those to help with heavy cycles.

## 2019-01-15 NOTE — Progress Notes (Signed)
    TELEHEALTH GYNECOLOGY VIRTUAL VIDEO VISIT ENCOUNTER NOTE  Provider location: [facility] [place]   I connected with Whitney Williams on 01/15/19 at  3:45 PM EDT by [facility] G[facility] MyChart Video Encounter at home and verified that I am speaking with the correct person using two identifiers.   I discussed the limitations, risks, security and privacy concerns of performing an evaluation and management service virtually and the availability of in person appointments. I also discussed with the patient that there may be a patient responsible charge related to this service. The patient expressed understanding and agreed to proceed.   History:  Whitney Williams is a 53 y.o. [phone] female being evaluated today for heavy and prolonged periods. She denies any abnormal vaginal discharge, pelvic pain or other concerns.  Contraception: None     Past Medical History:  Diagnosis Date  . Asthma    Past Surgical History:  Procedure Laterality Date  . BREAST LUMPECTOMY Right 2003 and 2004   x 2  . WRIST GANGLION EXCISION     x 5   The following portions of the patient's history were reviewed and updated as appropriate: allergies, current medications, past family history, past medical history, past social history, past surgical history and problem list.   Health Maintenance:  Normal pap and negative HRHPV on 08-21-2018.  Normal mammogram on 06-10-2018.   Review of Systems:  Pertinent items noted in HPI and remainder of comprehensive ROS otherwise negative.  Physical Exam:   General:  Alert, oriented and cooperative. Patient appears to be in no acute distress.  Mental Status: Normal mood and affect. Normal behavior. Normal judgment and thought content.   Respiratory: Normal respiratory effort, no problems with respiration noted  Rest of physical exam deferred due to type of encounter  Labs and Imaging No results found for this or any previous visit (from the past 336  hour(s)). No results found.     Assessment and Plan:      1. Abnormal uterine bleeding (AUB) Rx: - Norethin Ace-Eth Estrad-FE (TAYTULLA) 1-20 MG-MCG(24) CAPS; Take 1 capsule by mouth daily.  Dispense: 28 capsule; Refill: 11  2. Fibroids, intramural, small - Endometrial Biopsy normal in August 2019  3. Perimenopause       I discussed the assessment and treatment plan with the patient. The patient was provided an opportunity to ask questions and all were answered. The patient agreed with the plan and demonstrated an understanding of the instructions.   The patient was advised to call back or seek an in-person evaluation/go to the ED if the symptoms worsen or if the condition fails to improve as anticipated.  I provided 15 minutes of face-to-face time during this encounter.   Baltazar Najjar, MD [facility] 01-15-2019

## 2019-02-02 ENCOUNTER — Ambulatory Visit: Payer: BC Managed Care – PPO | Admitting: Obstetrics and Gynecology

## 2019-07-13 ENCOUNTER — Other Ambulatory Visit: Payer: Self-pay | Admitting: Obstetrics

## 2019-07-13 DIAGNOSIS — Z1231 Encounter for screening mammogram for malignant neoplasm of breast: Secondary | ICD-10-CM

## 2019-07-17 ENCOUNTER — Other Ambulatory Visit: Payer: Self-pay

## 2019-07-17 ENCOUNTER — Ambulatory Visit
Admission: RE | Admit: 2019-07-17 | Discharge: 2019-07-17 | Disposition: A | Discharge status: Home / Self Care | Payer: BC Managed Care – PPO | Source: Ambulatory Visit | Attending: Obstetrics | Admitting: Obstetrics

## 2019-07-17 DIAGNOSIS — Z1231 Encounter for screening mammogram for malignant neoplasm of breast: Secondary | ICD-10-CM

## 2019-07-21 ENCOUNTER — Other Ambulatory Visit: Payer: Self-pay | Admitting: Obstetrics

## 2019-07-21 DIAGNOSIS — N6489 Other specified disorders of breast: Secondary | ICD-10-CM

## 2019-08-07 ENCOUNTER — Ambulatory Visit: Payer: BC Managed Care – PPO

## 2019-08-07 ENCOUNTER — Other Ambulatory Visit: Payer: Self-pay

## 2019-08-07 ENCOUNTER — Ambulatory Visit
Admission: RE | Admit: 2019-08-07 | Discharge: 2019-08-07 | Disposition: A | Discharge status: Home / Self Care | Payer: BC Managed Care – PPO | Source: Ambulatory Visit | Attending: Obstetrics | Admitting: Obstetrics

## 2019-08-07 DIAGNOSIS — N6489 Other specified disorders of breast: Secondary | ICD-10-CM

## 2019-08-08 ENCOUNTER — Other Ambulatory Visit: Payer: Self-pay | Admitting: Obstetrics

## 2019-08-12 ENCOUNTER — Other Ambulatory Visit: Payer: Self-pay | Admitting: Obstetrics

## 2019-10-21 ENCOUNTER — Encounter: Payer: Self-pay | Admitting: Obstetrics

## 2019-10-21 ENCOUNTER — Other Ambulatory Visit (HOSPITAL_COMMUNITY)
Admission: RE | Admit: 2019-10-21 | Discharge: 2019-10-21 | Disposition: A | Discharge status: Home / Self Care | Payer: BC Managed Care – PPO | Source: Ambulatory Visit | Attending: Obstetrics | Admitting: Obstetrics

## 2019-10-21 ENCOUNTER — Other Ambulatory Visit: Payer: Self-pay

## 2019-10-21 ENCOUNTER — Ambulatory Visit (INDEPENDENT_AMBULATORY_CARE_PROVIDER_SITE_OTHER): Payer: BC Managed Care – PPO | Admitting: Obstetrics

## 2019-10-21 VITALS — BP 130/86 | HR 85 | Ht 63.0 in | Wt 166.0 lb

## 2019-10-21 DIAGNOSIS — B9689 Other specified bacterial agents as the cause of diseases classified elsewhere: Secondary | ICD-10-CM | POA: Insufficient documentation

## 2019-10-21 DIAGNOSIS — B373 Candidiasis of vulva and vagina: Secondary | ICD-10-CM

## 2019-10-21 DIAGNOSIS — Z01419 Encounter for gynecological examination (general) (routine) without abnormal findings: Secondary | ICD-10-CM | POA: Insufficient documentation

## 2019-10-21 DIAGNOSIS — Z113 Encounter for screening for infections with a predominantly sexual mode of transmission: Secondary | ICD-10-CM | POA: Diagnosis not present

## 2019-10-21 DIAGNOSIS — Z0000 Encounter for general adult medical examination without abnormal findings: Secondary | ICD-10-CM

## 2019-10-21 DIAGNOSIS — J301 Allergic rhinitis due to pollen: Secondary | ICD-10-CM

## 2019-10-21 DIAGNOSIS — N760 Acute vaginitis: Secondary | ICD-10-CM

## 2019-10-21 DIAGNOSIS — R519 Headache, unspecified: Secondary | ICD-10-CM

## 2019-10-21 DIAGNOSIS — E663 Overweight: Secondary | ICD-10-CM

## 2019-10-21 DIAGNOSIS — N898 Other specified noninflammatory disorders of vagina: Secondary | ICD-10-CM

## 2019-10-21 DIAGNOSIS — Z1272 Encounter for screening for malignant neoplasm of vagina: Secondary | ICD-10-CM

## 2019-10-21 DIAGNOSIS — B3731 Acute candidiasis of vulva and vagina: Secondary | ICD-10-CM

## 2019-10-21 MED ORDER — TINIDAZOLE 500 MG PO TABS: 1000.0000 mg | ORAL_TABLET | Freq: Every day | ORAL | 2 refills | Status: DC

## 2019-10-21 MED ORDER — PNV PRENATAL PLUS MULTIVITAMIN 27-1 MG PO TABS: ORAL_TABLET | ORAL | 4 refills | Status: DC

## 2019-10-21 MED ORDER — LORATADINE 10 MG PO TABS: 10.0000 mg | ORAL_TABLET | Freq: Every day | ORAL | 11 refills | Status: DC

## 2019-10-21 MED ORDER — FLUCONAZOLE 150 MG PO TABS: 150.0000 mg | ORAL_TABLET | Freq: Once | ORAL | 0 refills | Status: DC

## 2019-10-21 NOTE — Progress Notes (Signed)
Subjective:        Whitney Williams is a 54 y.o. female here for a routine exam.  Current complaints: Worsening headaches and seasonal allergies.    Personal health questionnaire:  Is patient [name], have a family history of breast and/or ovarian cancer: yes Is there a family history of uterine cancer diagnosed at age < 29, gastrointestinal cancer, urinary tract cancer, family member who is a Field seismologist syndrome-associated carrier: no Is the patient overweight and hypertensive, family history of diabetes, personal history of gestational diabetes, preeclampsia or PCOS: yes Is patient over 56, have PCOS,  family history of premature CHD under age 34, diabetes, smoke, have hypertension or peripheral artery disease:  no At any time, has a partner hit, kicked or otherwise hurt or frightened you?: no Over the past 2 weeks, have you felt down, depressed or hopeless?: no Over the past 2 weeks, have you felt little interest or pleasure in doing things?:no   Gynecologic History Patient's last menstrual period was 10/10/2019 (exact date). Contraception: status post hysterectomy Last Pap: 2020. Results were: normal Last mammogram: 08-07-2019. Results were: normal  Obstetric History OB History  Gravida Para Term Preterm AB Living  3 3 3     3   SAB TAB Ectopic Multiple Live Births          3    # Outcome Date GA Lbr Len/2nd Weight Sex Delivery Anes PTL Lv  3 Term 12/17/03    F Vag-Spont   LIV  2 Term 02/12/95    F Vag-Spont   LIV  1 Term 08/11/93    [name]   LIV    Past Medical History:  Diagnosis Date  . Asthma     Past Surgical History:  Procedure Laterality Date  . BREAST LUMPECTOMY Right 2003 and 2004   x 2  . WRIST GANGLION EXCISION     x 5     Current Outpatient Medications:  .  acetaminophen-codeine (TYLENOL #3) 300-30 MG per tablet, Take 1-2 tablets by mouth every 6 (six) hours as needed for moderate pain. (Patient not taking: Reported on 05/01/2016),  Disp: 20 tablet, Rfl: 0 .  azithromycin (ZITHROMAX Z-PAK) 250 MG tablet, Use as directed (Patient not taking: Reported on 05/01/2016), Disp: 6 tablet, Rfl: 0 .  diphenhydrAMINE (BENADRYL) 25 MG tablet, Take 25 mg by mouth every 6 (six) hours as needed., Disp: , Rfl:  .  EPINEPHrine 0.15 MG/0.15ML IJ injection, Inject 0.15 mg into the muscle as needed for anaphylaxis., Disp: , Rfl:  .  fluconazole (DIFLUCAN) 150 MG tablet, Take 1 tablet (150 mg total) by mouth once for 1 dose., Disp: 1 tablet, Rfl: 0 .  HYDROcodone-acetaminophen (NORCO/VICODIN) 5-325 MG tablet, Take 1-2 tablets by mouth every 6 (six) hours as needed for moderate pain or severe pain. (Patient not taking: Reported on 01/15/2019), Disp: 15 tablet, Rfl: 0 .  loratadine (CLARITIN) 10 MG tablet, Take 1 tablet (10 mg total) by mouth daily., Disp: 30 tablet, Rfl: 11 .  methocarbamol (ROBAXIN) 500 MG tablet, Take 1 tablet (500 mg total) by mouth every 8 (eight) hours as needed for muscle spasms. (Patient not taking: Reported on 01/15/2019), Disp: 20 tablet, Rfl: 0 .  metroNIDAZOLE (FLAGYL) 500 MG tablet, Take 1 tablet (500 mg total) by mouth 2 (two) times daily. (Patient not taking: Reported on 05/06/2017), Disp: 14 tablet, Rfl: 2 .  Norethin Ace-Eth Estrad-FE (TAYTULLA) 1-20 MG-MCG(24) CAPS, Take 1 capsule by mouth daily., Disp: 28 capsule, Rfl:  11 .  Prenatal Vit-Fe Fumarate-FA (PNV PRENATAL PLUS MULTIVITAMIN) 27-1 MG TABS, TAKE ONE TABLET BY MOUTH ONCE DAILY BEFORE BREAKFAST, Disp: 90 tablet, Rfl: 4 .  Prenatal Vit-Fe Fumarate-FA (PRENATAL VITAMIN PLUS LOW IRON) 27-1 MG TABS, TAKE ONE TABLET BY MOUTH ONCE DAILY BEFORE BREAKFAST, Disp: 90 tablet, Rfl: 4 .  tinidazole (TINDAMAX) 500 MG tablet, Take 2 tablets (1,000 mg total) by mouth daily with breakfast., Disp: 10 tablet, Rfl: 2 Allergies  Allergen Reactions  . Ibuprofen Shortness Of Breath and Swelling    edema    Social History   Tobacco Use  . Smoking status: Former Smoker    Packs/day:  0.25    Types: Cigarettes    Quit date: 10/17/2007    Years since quitting: 12.0  . Smokeless tobacco: Never Used  Substance Use Topics  . Alcohol use: No    Family History  Problem Relation Age of Onset  . Breast cancer Other        maternal cousins x 2      Review of Systems  Constitutional: negative for fatigue and weight loss Respiratory: negative for cough and wheezing Cardiovascular: negative for chest pain, fatigue and palpitations Gastrointestinal: negative for abdominal pain and change in bowel habits Musculoskeletal:negative for myalgias Neurological: positive for headaches Behavioral/Psych: negative for abusive relationship, depression Endocrine: negative for temperature intolerance    Genitourinary:negative for abnormal menstrual periods, genital lesions, hot flashes, sexual problems and vaginal discharge Integument/breast: negative for breast lump, breast tenderness, nipple discharge and skin lesion(s)    Objective:       BP 130/86   Pulse 85   Ht 5\' 3"  (1.6 m)   Wt 166 lb (75.3 kg)   LMP 10/10/2019 (Exact Date)   BMI 29.41 kg/m  General:   alert and no distress  Skin:   no rash or abnormalities  Lungs:   clear to auscultation bilaterally  Heart:   regular rate and rhythm, S1, S2 normal, no murmur, click, rub or gallop  Breasts:   normal without suspicious masses, skin or nipple changes or axillary nodes  Abdomen:  normal findings: no organomegaly, soft, non-tender and no hernia  Pelvis:  External genitalia: normal general appearance Urinary system: urethral meatus normal and bladder without fullness, nontender Vaginal: normal without tenderness, induration or masses Cervix: absent Adnexa: normal bimanual exam Uterus: absent   Lab Review Urine pregnancy test Labs reviewed yes Radiologic studies reviewed no  50% of 25 min visit spent on counseling and coordination of care.   Assessment:     1. Encounter for routine gynecological examination with  Papanicolaou smear of cervix Rx: - Cytology - PAP( [place])  2. Vaginal discharge Rx: - Cervicovaginal ancillary only( [place])  3. Screening for STD (sexually transmitted disease) Rx: - Hepatitis B surface antigen - Hepatitis C antibody - HIV Antibody (routine testing w rflx) - RPR  4. BV (bacterial vaginosis) Rx: - tinidazole (TINDAMAX) 500 MG tablet; Take 2 tablets (1,000 mg total) by mouth daily with breakfast.  Dispense: 10 tablet; Refill: 2  5. Candida vaginitis Rx: - fluconazole (DIFLUCAN) 150 MG tablet; Take 1 tablet (150 mg total) by mouth once for 1 dose.  Dispense: 1 tablet; Refill: 0  6. Routine adult health maintenance Rx: - Prenatal Vit-Fe Fumarate-FA (PNV PRENATAL PLUS MULTIVITAMIN) 27-1 MG TABS; TAKE ONE TABLET BY MOUTH ONCE DAILY BEFORE BREAKFAST  Dispense: 90 tablet; Refill: 4  7. Non-seasonal allergic rhinitis due to pollen Rx: - loratadine (CLARITIN) 10 MG tablet;  Take 1 tablet (10 mg total) by mouth daily.  Dispense: 30 tablet; Refill: 11  8. Generalized headaches Rx: - Ambulatory referral to Neurology    9. Overweight ( BMI = 29 ) - program of caloric reduction, exercise and behavioral modification recommended   Plan:    Education reviewed: calcium supplements, depression evaluation, low fat, low cholesterol diet, safe sex/STD prevention, self breast exams and weight bearing exercise. Follow up in: 1 year.   Meds ordered this encounter  Medications  . tinidazole (TINDAMAX) 500 MG tablet    Sig: Take 2 tablets (1,000 mg total) by mouth daily with breakfast.    Dispense:  10 tablet    Refill:  2  . fluconazole (DIFLUCAN) 150 MG tablet    Sig: Take 1 tablet (150 mg total) by mouth once for 1 dose.    Dispense:  1 tablet    Refill:  0  . Prenatal Vit-Fe Fumarate-FA (PNV PRENATAL PLUS MULTIVITAMIN) 27-1 MG TABS    Sig: TAKE ONE TABLET BY MOUTH ONCE DAILY BEFORE BREAKFAST    Dispense:  90 tablet    Refill:  4   Orders Placed This  Encounter  Procedures  . Hepatitis B surface antigen  . Hepatitis C antibody  . HIV Antibody (routine testing w rflx)  . RPR    Shelly Bombard, MD 10/21/2019 4:07 PM

## 2019-10-21 NOTE — Progress Notes (Signed)
Patient presents for Annual Exam today.  Last pap:08/21/2018 WNL  Mammogram:3D on 07/21/2019  Diag Uni Left Breast 08/07/19 Colonoscopy: Last year per pt WNL  STD Screening: Full Panel  LMP: 10/10/2019  CC: None

## 2019-10-22 ENCOUNTER — Other Ambulatory Visit: Payer: Self-pay | Admitting: Obstetrics

## 2019-10-22 LAB — CERVICOVAGINAL ANCILLARY ONLY
Bacterial Vaginitis (gardnerella): POSITIVE — AB
Candida Glabrata: NEGATIVE
Candida Vaginitis: NEGATIVE
Chlamydia: NEGATIVE
Comment: NEGATIVE
Comment: NEGATIVE
Comment: NEGATIVE
Comment: NEGATIVE
Comment: NEGATIVE
Comment: NORMAL
Neisseria Gonorrhea: NEGATIVE
Trichomonas: NEGATIVE

## 2019-10-22 LAB — CYTOLOGY - PAP
Comment: NEGATIVE
Diagnosis: NEGATIVE
High risk HPV: NEGATIVE

## 2019-10-22 LAB — HEPATITIS B SURFACE ANTIGEN: Hepatitis B Surface Ag: NEGATIVE

## 2019-10-22 LAB — HIV ANTIBODY (ROUTINE TESTING W REFLEX): HIV Screen 4th Generation wRfx: NONREACTIVE

## 2019-10-22 LAB — RPR: RPR Ser Ql: NONREACTIVE

## 2019-10-22 LAB — HEPATITIS C ANTIBODY: Hep C Virus Ab: <0.1 s/co ratio (ref 0.0–0.9)
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
## 2019-12-29 ENCOUNTER — Ambulatory Visit: Payer: BLUE CROSS/BLUE SHIELD | Admitting: Neurology

## 2019-12-29 ENCOUNTER — Encounter: Payer: Self-pay | Admitting: Neurology

## 2019-12-29 ENCOUNTER — Telehealth: Payer: Self-pay | Admitting: Neurology

## 2019-12-29 NOTE — Telephone Encounter (Signed)
Pt was a no show for the new patient visit for today.

## 2020-02-12 ENCOUNTER — Telehealth: Payer: Self-pay | Admitting: *Deleted

## 2020-02-12 NOTE — Telephone Encounter (Signed)
Pt called to office stating she would like her BC pills changed back to Loestrin 24FE. Pt states her cycles are no better with her current BC pills.    LM on VM making pt aware message to be sent to provider.   Please change and send new [facility] Rx for Loestrin 24 fe.

## 2020-02-16 ENCOUNTER — Other Ambulatory Visit: Payer: Self-pay | Admitting: Obstetrics

## 2020-02-16 NOTE — Telephone Encounter (Signed)
The patient was given the generic for Loestrin 1-20 Fe.

## 2020-03-01 ENCOUNTER — Other Ambulatory Visit: Payer: Self-pay

## 2020-03-01 DIAGNOSIS — B3731 Acute candidiasis of vulva and vagina: Secondary | ICD-10-CM

## 2020-03-01 MED ORDER — FLUCONAZOLE 150 MG PO TABS: 150.0000 mg | ORAL_TABLET | Freq: Once | ORAL | 0 refills | Status: AC

## 2020-03-01 NOTE — Progress Notes (Signed)
Pt called requesting refill on Tindamax for BV, I advised pt that she has 2 refills on the Rx at her pharmacy and to call and request refill from pharmacy. Pt also requests refill on diflucan due to reporting that she gets yeast infection symptoms after taking antibiotics. I advised pt I will send her one diflucan to take after completing antibiotics, pt voices understanding. I also advised pt that if her symptoms do not resolve she will need to be seen in office for evaluation, pt voices understanding.

## 2020-04-18 ENCOUNTER — Other Ambulatory Visit: Payer: Self-pay | Admitting: Obstetrics

## 2020-04-22 ENCOUNTER — Ambulatory Visit: Payer: BC Managed Care – PPO | Admitting: Obstetrics

## 2020-04-29 ENCOUNTER — Ambulatory Visit: Payer: BC Managed Care – PPO | Admitting: Obstetrics

## 2020-08-10 ENCOUNTER — Other Ambulatory Visit: Payer: Self-pay | Admitting: Obstetrics

## 2020-08-10 DIAGNOSIS — Z1231 Encounter for screening mammogram for malignant neoplasm of breast: Secondary | ICD-10-CM

## 2020-08-11 ENCOUNTER — Ambulatory Visit
Admission: RE | Admit: 2020-08-11 | Discharge: 2020-08-11 | Disposition: A | Discharge status: Home / Self Care | Payer: BC Managed Care – PPO | Source: Ambulatory Visit | Attending: Obstetrics | Admitting: Obstetrics

## 2020-08-11 ENCOUNTER — Other Ambulatory Visit: Payer: Self-pay

## 2020-08-11 DIAGNOSIS — Z1231 Encounter for screening mammogram for malignant neoplasm of breast: Secondary | ICD-10-CM

## 2020-08-16 ENCOUNTER — Other Ambulatory Visit: Payer: Self-pay | Admitting: Obstetrics

## 2020-08-16 DIAGNOSIS — R928 Other abnormal and inconclusive findings on diagnostic imaging of breast: Secondary | ICD-10-CM

## 2020-08-31 ENCOUNTER — Other Ambulatory Visit: Payer: Self-pay

## 2020-08-31 ENCOUNTER — Ambulatory Visit
Admission: RE | Admit: 2020-08-31 | Discharge: 2020-08-31 | Disposition: A | Discharge status: Home / Self Care | Payer: BC Managed Care – PPO | Source: Ambulatory Visit | Attending: Obstetrics | Admitting: Obstetrics

## 2020-08-31 ENCOUNTER — Ambulatory Visit: Payer: BC Managed Care – PPO

## 2020-08-31 DIAGNOSIS — R928 Other abnormal and inconclusive findings on diagnostic imaging of breast: Secondary | ICD-10-CM

## 2020-11-25 ENCOUNTER — Encounter: Payer: Self-pay | Admitting: Obstetrics

## 2020-11-25 ENCOUNTER — Other Ambulatory Visit: Payer: Self-pay

## 2020-11-25 ENCOUNTER — Ambulatory Visit (INDEPENDENT_AMBULATORY_CARE_PROVIDER_SITE_OTHER): Payer: BC Managed Care – PPO | Admitting: Obstetrics

## 2020-11-25 ENCOUNTER — Other Ambulatory Visit (HOSPITAL_COMMUNITY)
Admission: RE | Admit: 2020-11-25 | Discharge: 2020-11-25 | Disposition: A | Discharge status: Home / Self Care | Payer: BC Managed Care – PPO | Source: Ambulatory Visit | Attending: Obstetrics | Admitting: Obstetrics

## 2020-11-25 VITALS — Ht 63.0 in | Wt 170.7 lb

## 2020-11-25 DIAGNOSIS — Z113 Encounter for screening for infections with a predominantly sexual mode of transmission: Secondary | ICD-10-CM

## 2020-11-25 DIAGNOSIS — E569 Vitamin deficiency, unspecified: Secondary | ICD-10-CM

## 2020-11-25 DIAGNOSIS — E669 Obesity, unspecified: Secondary | ICD-10-CM

## 2020-11-25 DIAGNOSIS — N898 Other specified noninflammatory disorders of vagina: Secondary | ICD-10-CM

## 2020-11-25 DIAGNOSIS — Z01419 Encounter for gynecological examination (general) (routine) without abnormal findings: Secondary | ICD-10-CM | POA: Insufficient documentation

## 2020-11-25 MED ORDER — PRENATAL VITAMIN PLUS LOW IRON 27-1 MG PO TABS: ORAL_TABLET | ORAL | 4 refills | Status: DC

## 2020-11-25 NOTE — Progress Notes (Signed)
Subjective:        Whitney Williams is a 55 y.o. female here for a routine exam.  Current complaints: Vaginal discharge.    Personal health questionnaire:  Is patient [name], have a family history of breast and/or ovarian cancer: no Is there a family history of uterine cancer diagnosed at age < 27, gastrointestinal cancer, urinary tract cancer, family member who is a Field seismologist syndrome-associated carrier: no Is the patient overweight and hypertensive, family history of diabetes, personal history of gestational diabetes, preeclampsia or PCOS: no Is patient over 48, have PCOS,  family history of premature CHD under age 76, diabetes, smoke, have hypertension or peripheral artery disease:  no At any time, has a partner hit, kicked or otherwise hurt or frightened you?: no Over the past 2 weeks, have you felt down, depressed or hopeless?: no Over the past 2 weeks, have you felt little interest or pleasure in doing things?:no   Gynecologic History No LMP recorded. Contraception: post menopausal status Last Pap: 10-21-2019. Results were: normal Last mammogram: 08-31-2020. Results were: normal  Obstetric History OB History  Gravida Para Term Preterm AB Living  3 3 3     3   SAB IAB Ectopic Multiple Live Births          3    # Outcome Date GA Lbr Len/2nd Weight Sex Delivery Anes PTL Lv  3 Term 12/17/03    F Vag-Spont   LIV  2 Term 02/12/95    F Vag-Spont   LIV  1 Term 08/11/93    [name]   LIV    Past Medical History:  Diagnosis Date   Asthma     Past Surgical History:  Procedure Laterality Date   BREAST LUMPECTOMY Right 2003 and 2004   x 2   WRIST GANGLION EXCISION     x 5     Current Outpatient Medications:    loratadine (CLARITIN) 10 MG tablet, Take 1 tablet (10 mg total) by mouth daily., Disp: 30 tablet, Rfl: 11   acetaminophen-codeine (TYLENOL #3) 300-30 MG per tablet, Take 1-2 tablets by mouth every 6 (six) hours as needed for moderate pain., Disp: 20  tablet, Rfl: 0   azithromycin (ZITHROMAX Z-PAK) 250 MG tablet, Use as directed, Disp: 6 tablet, Rfl: 0   diphenhydrAMINE (BENADRYL) 25 MG tablet, Take 25 mg by mouth every 6 (six) hours as needed. (Patient not taking: Reported on 11/25/2020), Disp: , Rfl:    EPINEPHrine 0.15 MG/0.15ML IJ injection, Inject 0.15 mg into the muscle as needed for anaphylaxis. (Patient not taking: Reported on 11/25/2020), Disp: , Rfl:    HYDROcodone-acetaminophen (NORCO/VICODIN) 5-325 MG tablet, Take 1-2 tablets by mouth every 6 (six) hours as needed for moderate pain or severe pain., Disp: 15 tablet, Rfl: 0   loratadine (CLARITIN) 10 MG tablet, Take 1 tablet (10 mg total) by mouth daily., Disp: 30 tablet, Rfl: 11   methocarbamol (ROBAXIN) 500 MG tablet, Take 1 tablet (500 mg total) by mouth every 8 (eight) hours as needed for muscle spasms., Disp: 20 tablet, Rfl: 0   metroNIDAZOLE (FLAGYL) 500 MG tablet, Take 1 tablet (500 mg total) by mouth 2 (two) times daily. (Patient not taking: No sig reported), Disp: 14 tablet, Rfl: 2   Norethin Ace-Eth Estrad-FE (TAYTULLA) 1-20 MG-MCG(24) CAPS, Take 1 capsule by mouth daily., Disp: 28 capsule, Rfl: 11   Prenatal Vit-Fe Fumarate-FA (PNV PRENATAL PLUS MULTIVITAMIN) 27-1 MG TABS, TAKE ONE TABLET BY MOUTH ONCE DAILY BEFORE BREAKFAST (Patient  not taking: Reported on 11/25/2020), Disp: 90 tablet, Rfl: 4   Prenatal Vit-Fe Fumarate-FA (PRENATAL VITAMIN PLUS LOW IRON) 27-1 MG TABS, TAKE ONE TABLET BY MOUTH ONCE DAILY BEFORE BREAKFAST, Disp: 90 tablet, Rfl: 4   tinidazole (TINDAMAX) 500 MG tablet, Take 2 tablets (1,000 mg total) by mouth daily with breakfast., Disp: 10 tablet, Rfl: 2 Allergies  Allergen Reactions   Ibuprofen Shortness Of Breath and Swelling    edema    Social History   Tobacco Use   Smoking status: Former    Packs/day: 0.25    Pack years: 0.00    Types: Cigarettes    Quit date: 10/17/2007    Years since quitting: 13.1   Smokeless tobacco: Never  Substance Use Topics    Alcohol use: No    Family History  Problem Relation Age of Onset   Breast cancer Other        maternal cousins x 2      Review of Systems  Constitutional: negative for fatigue and weight loss Respiratory: negative for cough and wheezing Cardiovascular: negative for chest pain, fatigue and palpitations Gastrointestinal: negative for abdominal pain and change in bowel habits Musculoskeletal:negative for myalgias Neurological: negative for gait problems and tremors Behavioral/Psych: negative for abusive relationship, depression Endocrine: negative for temperature intolerance    Genitourinary: positive for vaginal discharge.  negative for abnormal menstrual periods, genital lesions, hot flashes, sexual problems  Integument/breast: negative for breast lump, breast tenderness, nipple discharge and skin lesion(s)    Objective:       Ht 5\' 3"  (1.6 m)   Wt 170 lb 11.2 oz (77.4 kg)   BMI 30.24 kg/m  General:   Alert and no distress  Skin:   no rash or abnormalities  Lungs:   clear to auscultation bilaterally  Heart:   regular rate and rhythm, S1, S2 normal, no murmur, click, rub or gallop  Breasts:   normal without suspicious masses, skin or nipple changes or axillary nodes  Abdomen:  normal findings: no organomegaly, soft, non-tender and no hernia  Pelvis:  External genitalia: normal general appearance Urinary system: urethral meatus normal and bladder without fullness, nontender Vaginal: normal without tenderness, induration or masses Cervix: normal appearance Adnexa: normal bimanual exam Uterus: anteverted and non-tender, normal size   Lab Review Urine pregnancy test Labs reviewed yes Radiologic studies reviewed yes  I have spent a total of 20 minutes of face-to-face time, excluding clinical staff time, reviewing notes and preparing to see patient, ordering tests and/or medications, and counseling the patient.   Assessment:    1. Encounter for routine gynecological  examination with Papanicolaou smear of cervix Rx: - Cytology - PAP( [place])  2. Vaginal discharge Rx:" - Cervicovaginal ancillary only( [place])  3. Screening for STD (sexually transmitted disease) Rx:- RPR+HBsAg+HCVAb+...  4. Vitamin deficiency  5. Obesity (BMI 30.0-34.9)     Plan:    Education reviewed: calcium supplements, depression evaluation, low fat, low cholesterol diet, safe sex/STD prevention, self breast exams, and weight bearing exercise. Follow up in: 1 year.   Meds ordered this encounter  Medications   Prenatal Vit-Fe Fumarate-FA (PRENATAL VITAMIN PLUS LOW IRON) 27-1 MG TABS    Sig: TAKE ONE TABLET BY MOUTH ONCE DAILY BEFORE BREAKFAST    Dispense:  90 tablet    Refill:  4   Orders Placed This Encounter  Procedures   RPR+HBsAg+HCVAb+...     Shelly Bombard, MD 11/25/2020 11:22 AM

## 2020-11-25 NOTE — Progress Notes (Signed)
Annual exam. LMP 08/11/20. Periods were regular before that time. Experiencing hot flashes. Reports weight gain.  Wants STD testing, swab and labs.  Needs new RX epipen and refill PNV

## 2020-11-26 LAB — RPR+HBSAG+HCVAB+...
HIV Screen 4th Generation wRfx: NONREACTIVE
Hep C Virus Ab: 0.1 s/co ratio (ref 0.0–0.9)
Hepatitis B Surface Ag: NEGATIVE
RPR Ser Ql: NONREACTIVE

## 2020-11-28 LAB — CYTOLOGY - PAP
Comment: NEGATIVE
Diagnosis: NEGATIVE
High risk HPV: NEGATIVE

## 2020-11-29 LAB — CERVICOVAGINAL ANCILLARY ONLY
Bacterial Vaginitis (gardnerella): NEGATIVE
Candida Glabrata: NEGATIVE
Candida Vaginitis: NEGATIVE
Chlamydia: NEGATIVE
Comment: NEGATIVE
Comment: NEGATIVE
Comment: NEGATIVE
Comment: NEGATIVE
Comment: NEGATIVE
Comment: NORMAL
Neisseria Gonorrhea: NEGATIVE
Trichomonas: NEGATIVE

## 2020-12-08 ENCOUNTER — Emergency Department (HOSPITAL_BASED_OUTPATIENT_CLINIC_OR_DEPARTMENT_OTHER)
Admission: EM | Admit: 2020-12-08 | Discharge: 2020-12-09 | Disposition: A | Discharge status: Home / Self Care | Payer: BC Managed Care – PPO | Attending: Emergency Medicine | Admitting: Emergency Medicine

## 2020-12-08 ENCOUNTER — Encounter (HOSPITAL_BASED_OUTPATIENT_CLINIC_OR_DEPARTMENT_OTHER): Payer: Self-pay | Admitting: *Deleted

## 2020-12-08 ENCOUNTER — Other Ambulatory Visit: Payer: Self-pay

## 2020-12-08 DIAGNOSIS — U071 COVID-19: Secondary | ICD-10-CM | POA: Diagnosis not present

## 2020-12-08 DIAGNOSIS — R059 Cough, unspecified: Secondary | ICD-10-CM | POA: Diagnosis present

## 2020-12-08 DIAGNOSIS — J45909 Unspecified asthma, uncomplicated: Secondary | ICD-10-CM | POA: Diagnosis not present

## 2020-12-08 DIAGNOSIS — Z87891 Personal history of nicotine dependence: Secondary | ICD-10-CM | POA: Insufficient documentation

## 2020-12-08 NOTE — ED Triage Notes (Signed)
C/o cough x 1 week.

## 2020-12-09 ENCOUNTER — Encounter (HOSPITAL_BASED_OUTPATIENT_CLINIC_OR_DEPARTMENT_OTHER): Payer: Self-pay | Admitting: Emergency Medicine

## 2020-12-09 ENCOUNTER — Emergency Department (HOSPITAL_BASED_OUTPATIENT_CLINIC_OR_DEPARTMENT_OTHER): Payer: BC Managed Care – PPO

## 2020-12-09 ENCOUNTER — Other Ambulatory Visit (HOSPITAL_BASED_OUTPATIENT_CLINIC_OR_DEPARTMENT_OTHER): Payer: Self-pay

## 2020-12-09 LAB — CBC WITH DIFFERENTIAL/PLATELET
Abs Immature Granulocytes: 0.02 10*3/uL (ref 0.00–0.07)
Basophils Absolute: 0.1 10*3/uL (ref 0.0–0.1)
Basophils Relative: 1 %
Eosinophils Absolute: 0.5 10*3/uL (ref 0.0–0.5)
Eosinophils Relative: 7 %
HCT: 37.6 % (ref 36.0–46.0)
Hemoglobin: 12 g/dL (ref 12.0–15.0)
Immature Granulocytes: 0 %
Lymphocytes Relative: 37 %
Lymphs Abs: 2.7 10*3/uL (ref 0.7–4.0)
MCH: 21.9 pg — ABNORMAL LOW (ref 26.0–34.0)
MCHC: 31.9 g/dL (ref 30.0–36.0)
MCV: 68.6 fL — ABNORMAL LOW (ref 80.0–100.0)
Monocytes Absolute: 0.7 10*3/uL (ref 0.1–1.0)
Monocytes Relative: 10 %
Neutro Abs: 3.2 10*3/uL (ref 1.7–7.7)
Neutrophils Relative %: 45 %
Platelets: 304 10*3/uL (ref 150–400)
RBC: 5.48 MIL/uL — ABNORMAL HIGH (ref 3.87–5.11)
RDW: 16.3 % — ABNORMAL HIGH (ref 11.5–15.5)
WBC: 7.1 10*3/uL (ref 4.0–10.5)
nRBC: 0 % (ref 0.0–0.2)

## 2020-12-09 LAB — RESP PANEL BY RT-PCR (FLU A&B, COVID) ARPGX2
Influenza A by PCR: NEGATIVE
Influenza B by PCR: NEGATIVE
SARS Coronavirus 2 by RT PCR: POSITIVE — AB

## 2020-12-09 LAB — BASIC METABOLIC PANEL
Anion gap: 6 (ref 5–15)
BUN: 14 mg/dL (ref 6–20)
CO2: 24 mmol/L (ref 22–32)
Calcium: 8.6 mg/dL — ABNORMAL LOW (ref 8.9–10.3)
Chloride: 107 mmol/L (ref 98–111)
Creatinine, Ser: 0.79 mg/dL (ref 0.44–1.00)
GFR, Estimated: >60 mL/min (ref >60)
Glucose, Bld: 100 mg/dL — ABNORMAL HIGH (ref 70–99)
Potassium: 3.7 mmol/L (ref 3.5–5.1)
Sodium: 137 mmol/L (ref 135–145)

## 2020-12-09 MED ORDER — DIPHENHYDRAMINE HCL 50 MG/ML IJ SOLN: 50.0000 mg | Freq: Once | INTRAMUSCULAR | Status: DC | PRN

## 2020-12-09 MED ORDER — SODIUM CHLORIDE 0.9 % IV SOLN: INTRAVENOUS | Status: DC | PRN

## 2020-12-09 MED ORDER — FAMOTIDINE IN NACL 20-0.9 MG/50ML-% IV SOLN: 20.0000 mg | Freq: Once | INTRAVENOUS | Status: DC | PRN

## 2020-12-09 MED ORDER — EPINEPHRINE 0.3 MG/0.3ML IJ SOAJ: 0.3000 mg | Freq: Once | INTRAMUSCULAR | Status: DC | PRN

## 2020-12-09 MED ORDER — METHYLPREDNISOLONE SODIUM SUCC 125 MG IJ SOLR
125.0000 mg | Freq: Once | INTRAMUSCULAR | Status: DC | PRN
Start: 2020-12-09 — End: 2020-12-09

## 2020-12-09 MED ORDER — BEBTELOVIMAB 175 MG/2 ML IV (EUA)
175.0000 mg | Freq: Once | INTRAMUSCULAR | Status: DC
  Filled 2020-12-09: qty 2

## 2020-12-09 MED ORDER — PAXLOVID 20 X 150 MG & 10 X 100MG PO TBPK
2.0000 | ORAL_TABLET | Freq: Two times a day (BID) | ORAL | 0 refills | Status: AC
  Filled 2020-12-09: qty 30, 5d supply, fill #0

## 2020-12-09 MED ORDER — ALBUTEROL SULFATE HFA 108 (90 BASE) MCG/ACT IN AERS: 2.0000 | INHALATION_SPRAY | Freq: Once | RESPIRATORY_TRACT | Status: DC | PRN

## 2020-12-09 NOTE — ED Provider Notes (Signed)
[place] EMERGENCY DEPARTMENT Provider Note   CSN: [id number] Arrival date & time: 12/08/20  2334     History Chief Complaint  Patient presents with   Cough    Whitney Williams is a 55 y.o. female.  Patient is a 55 year old female with past medical history of asthma.  She presents today with complaints of cough and URI symptoms for the past week.  Cough is intermittently productive of white sputum.  She denies fevers or chills.  She denies chest pain or difficulty breathing.  The history is provided by the patient.  Cough Cough characteristics:  Productive Sputum characteristics:  White Severity:  Moderate Onset quality:  Gradual Duration:  1 week Timing:  Constant Progression:  Worsening Chronicity:  New Relieved by:  Nothing Worsened by:  Nothing Ineffective treatments:  Decongestant Associated symptoms: no chest pain and no fever       Past Medical History:  Diagnosis Date   Asthma     Patient Active Problem List   Diagnosis Date Noted   Mastodynia 11/20/2012   Allergic rhinitis, seasonal 10/16/2012   Menorrhagia 10/16/2012   Mastodynia, female 10/16/2012    Past Surgical History:  Procedure Laterality Date   BREAST LUMPECTOMY Right 2003 and 2004   x 2   WRIST GANGLION EXCISION     x 5     OB History     Gravida  3   Para  3   Term  3   Preterm      AB      Living  3      SAB      IAB      Ectopic      Multiple      Live Births  3           Family History  Problem Relation Age of Onset   Breast cancer Other        maternal cousins x 2    Social History   Tobacco Use   Smoking status: Former    Packs/day: 0.25    Pack years: 0.00    Types: Cigarettes    Quit date: 10/17/2007    Years since quitting: 13.1   Smokeless tobacco: Never  Vaping Use   Vaping Use: Never used  Substance Use Topics   Alcohol use: No   Drug use: No    Home Medications Prior to Admission medications   Medication Sig  Start Date End Date Taking? Authorizing Provider  acetaminophen-codeine (TYLENOL #3) 300-30 MG per tablet Take 1-2 tablets by mouth every 6 (six) hours as needed for moderate pain. 05/12/14   Larene Pickett, PA-C  azithromycin (ZITHROMAX Z-PAK) 250 MG tablet Use as directed 07/22/14   Shelly Bombard, MD  diphenhydrAMINE (BENADRYL) 25 MG tablet Take 25 mg by mouth every 6 (six) hours as needed. Patient not taking: Reported on 11/25/2020    [provider]  EPINEPHrine 0.15 MG/0.15ML IJ injection Inject 0.15 mg into the muscle as needed for anaphylaxis. Patient not taking: Reported on 11/25/2020    [provider]  HYDROcodone-acetaminophen (NORCO/VICODIN) 5-325 MG tablet Take 1-2 tablets by mouth every 6 (six) hours as needed for moderate pain or severe pain. 01/26/18   Duffy Bruce, MD  loratadine (CLARITIN) 10 MG tablet Take 1 tablet (10 mg total) by mouth daily. 08/21/18   Shelly Bombard, MD  loratadine (CLARITIN) 10 MG tablet Take 1 tablet (10 mg total) by mouth daily. 10/21/19  Shelly Bombard, MD  methocarbamol (ROBAXIN) 500 MG tablet Take 1 tablet (500 mg total) by mouth every 8 (eight) hours as needed for muscle spasms. 01/26/18   Duffy Bruce, MD  metroNIDAZOLE (FLAGYL) 500 MG tablet Take 1 tablet (500 mg total) by mouth 2 (two) times daily. Patient not taking: No sig reported 05/07/16   Shelly Bombard, MD  Norethin Ace-Eth Estrad-FE (TAYTULLA) 1-20 MG-MCG(24) CAPS Take 1 capsule by mouth daily. 01/15/19   Shelly Bombard, MD  Prenatal Vit-Fe Fumarate-FA (PNV PRENATAL PLUS MULTIVITAMIN) 27-1 MG TABS TAKE ONE TABLET BY MOUTH ONCE DAILY BEFORE BREAKFAST Patient not taking: Reported on 11/25/2020 10/21/19   Shelly Bombard, MD  Prenatal Vit-Fe Fumarate-FA (PRENATAL VITAMIN PLUS LOW IRON) 27-1 MG TABS TAKE ONE TABLET BY MOUTH ONCE DAILY BEFORE BREAKFAST 11/25/20   Shelly Bombard, MD  tinidazole (TINDAMAX) 500 MG tablet Take 2 tablets (1,000 mg total) by mouth daily  with breakfast. 10/21/19   Shelly Bombard, MD    Allergies    Ibuprofen  Review of Systems   Review of Systems  Constitutional:  Negative for fever.  Respiratory:  Positive for cough.   Cardiovascular:  Negative for chest pain.  All other systems reviewed and are negative.  Physical Exam Updated Vital Signs BP 125/87   Pulse 84   Temp 98.4 F (36.9 C) (Oral)   Resp 16   Ht 5\' 3"  (1.6 m)   Wt 74.4 kg   LMP 10/10/2019 (Exact Date)   SpO2 100%   BMI 29.05 kg/m   Physical Exam Vitals and nursing note reviewed.  Constitutional:      General: She is not in acute distress.    Appearance: She is well-developed. She is not diaphoretic.  HENT:     Head: Normocephalic and atraumatic.  Cardiovascular:     Rate and Rhythm: Normal rate and regular rhythm.     Heart sounds: No murmur heard.   No friction rub. No gallop.  Pulmonary:     Effort: Pulmonary effort is normal. No respiratory distress.     Breath sounds: Normal breath sounds. No wheezing.  Abdominal:     General: Bowel sounds are normal. There is no distension.     Palpations: Abdomen is soft.     Tenderness: There is no abdominal tenderness.  Musculoskeletal:        General: Normal range of motion.     Cervical back: Normal range of motion and neck supple.  Skin:    General: Skin is warm and dry.  Neurological:     General: No focal deficit present.     Mental Status: She is alert and oriented to person, place, and time.    ED Results / Procedures / Treatments   Labs (all labs ordered are listed, but only abnormal results are displayed) Labs Reviewed  RESP PANEL BY RT-PCR (FLU A&B, COVID) [id number]    EKG None  Radiology No results found.  Procedures Procedures   Medications Ordered in ED Medications - No data to display  ED Course  I have reviewed the triage vital signs and the nursing notes.  Pertinent labs & imaging results that were available during my care of the patient were reviewed by me  and considered in my medical decision making (see chart for details).    MDM Rules/Calculators/A&P  Patient presenting with URI symptoms and positive COVID.  Vitals are stable and laboratory studies are reassuring.  Patient requesting paxlovid and will prescribe.  Final Clinical Impression(s) / ED Diagnoses Final diagnoses:  None    Rx / DC Orders ED Discharge Orders     None        Veryl Speak, MD 12/09/20 0500

## 2020-12-09 NOTE — Discharge Instructions (Addendum)
Begin taking Paxlovid as prescribed.  Drink plenty of fluids and get plenty of rest.  Quarantine at home for the next 5 days.

## 2020-12-15 ENCOUNTER — Other Ambulatory Visit (HOSPITAL_BASED_OUTPATIENT_CLINIC_OR_DEPARTMENT_OTHER): Payer: Self-pay

## 2021-01-19 ENCOUNTER — Encounter: Payer: Self-pay | Admitting: Obstetrics

## 2021-01-19 ENCOUNTER — Other Ambulatory Visit: Payer: Self-pay

## 2021-01-19 ENCOUNTER — Ambulatory Visit (INDEPENDENT_AMBULATORY_CARE_PROVIDER_SITE_OTHER): Payer: BC Managed Care – PPO | Admitting: Obstetrics

## 2021-01-19 ENCOUNTER — Other Ambulatory Visit: Payer: Self-pay | Admitting: Obstetrics

## 2021-01-19 VITALS — BP 134/84 | HR 87 | Wt 166.6 lb

## 2021-01-19 DIAGNOSIS — M5442 Lumbago with sciatica, left side: Secondary | ICD-10-CM | POA: Diagnosis not present

## 2021-01-19 DIAGNOSIS — G8929 Other chronic pain: Secondary | ICD-10-CM

## 2021-01-19 DIAGNOSIS — N644 Mastodynia: Secondary | ICD-10-CM | POA: Diagnosis not present

## 2021-01-19 DIAGNOSIS — M25511 Pain in right shoulder: Secondary | ICD-10-CM

## 2021-01-19 DIAGNOSIS — N62 Hypertrophy of breast: Secondary | ICD-10-CM | POA: Diagnosis not present

## 2021-01-19 NOTE — Progress Notes (Signed)
Patient ID: Whitney Williams, female   DOB: 20-Feb-1966, 55 y.o.   MRN: [id number]  Chief Complaint  Patient presents with   Breast Problem    HPI Whitney Williams is a 55 y.o. female.  Complains of right breast pain and possible lump.  Patient has large heavy breasts and has complaint of worsening shoulder and back pain for the past year despite wearing expensive sports bras. HPI  Past Medical History:  Diagnosis Date   Asthma     Past Surgical History:  Procedure Laterality Date   BREAST LUMPECTOMY Right 2003 and 2004   x 2   WRIST GANGLION EXCISION     x 5    Family History  Problem Relation Age of Onset   Breast cancer Other        maternal cousins x 2    Social History Social History   Tobacco Use   Smoking status: Former    Packs/day: 0.25    Types: Cigarettes    Quit date: 10/17/2007    Years since quitting: 13.2   Smokeless tobacco: Never  Vaping Use   Vaping Use: Never used  Substance Use Topics   Alcohol use: No   Drug use: No    Allergies  Allergen Reactions   Ibuprofen Shortness Of Breath and Swelling    edema    Current Outpatient Medications  Medication Sig Dispense Refill   loratadine (CLARITIN) 10 MG tablet Take 1 tablet (10 mg total) by mouth daily. 30 tablet 11   acetaminophen-codeine (TYLENOL #3) 300-30 MG per tablet Take 1-2 tablets by mouth every 6 (six) hours as needed for moderate pain. (Patient not taking: Reported on 01/19/2021) 20 tablet 0   azithromycin (ZITHROMAX Z-PAK) 250 MG tablet Use as directed (Patient not taking: Reported on 01/19/2021) 6 tablet 0   diphenhydrAMINE (BENADRYL) 25 MG tablet Take 25 mg by mouth every 6 (six) hours as needed. (Patient not taking: No sig reported)     EPINEPHrine 0.15 MG/0.15ML IJ injection Inject 0.15 mg into the muscle as needed for anaphylaxis. (Patient not taking: Reported on 01/19/2021)     HYDROcodone-acetaminophen (NORCO/VICODIN) 5-325 MG tablet Take 1-2 tablets by mouth every 6  (six) hours as needed for moderate pain or severe pain. (Patient not taking: Reported on 01/19/2021) 15 tablet 0   loratadine (CLARITIN) 10 MG tablet Take 1 tablet (10 mg total) by mouth daily. 30 tablet 11   methocarbamol (ROBAXIN) 500 MG tablet Take 1 tablet (500 mg total) by mouth every 8 (eight) hours as needed for muscle spasms. (Patient not taking: Reported on 01/19/2021) 20 tablet 0   metroNIDAZOLE (FLAGYL) 500 MG tablet Take 1 tablet (500 mg total) by mouth 2 (two) times daily. (Patient not taking: No sig reported) 14 tablet 2   Norethin Ace-Eth Estrad-FE (TAYTULLA) 1-20 MG-MCG(24) CAPS Take 1 capsule by mouth daily. (Patient not taking: Reported on 01/19/2021) 28 capsule 11   Prenatal Vit-Fe Fumarate-FA (PNV PRENATAL PLUS MULTIVITAMIN) 27-1 MG TABS TAKE ONE TABLET BY MOUTH ONCE DAILY BEFORE BREAKFAST (Patient not taking: No sig reported) 90 tablet 4   Prenatal Vit-Fe Fumarate-FA (PRENATAL VITAMIN PLUS LOW IRON) 27-1 MG TABS TAKE ONE TABLET BY MOUTH ONCE DAILY BEFORE BREAKFAST (Patient not taking: Reported on 01/19/2021) 90 tablet 4   tinidazole (TINDAMAX) 500 MG tablet Take 2 tablets (1,000 mg total) by mouth daily with breakfast. (Patient not taking: Reported on 01/19/2021) 10 tablet 2   No current facility-administered medications for this visit.  Review of Systems Review of Systems Constitutional: negative for fatigue and weight loss Respiratory: negative for cough and wheezing Cardiovascular: negative for chest pain, fatigue and palpitations Gastrointestinal: negative for abdominal pain and change in bowel habits Genitourinary:negative Integument/breast: positive for right breast pain Musculoskeletal:negative for myalgias Neurological: negative for gait problems and tremors Behavioral/Psych: negative for abusive relationship, depression Endocrine: negative for temperature intolerance      Blood pressure 134/84, pulse 87, weight 166 lb 9.6 oz (75.6 kg), last menstrual period  10/10/2019.  Physical Exam Physical Exam General:   Alert and no distress  Skin:   no rash or abnormalities  Lungs:   clear to auscultation bilaterally  Heart:   regular rate and rhythm, S1, S2 normal, no murmur, click, rub or gallop  Breasts:   normal without suspicious masses, skin or nipple changes or axillary nodes, but tender to palpation    Data Reviewed Notes from previous breast surgery  Assessment     1. Mastodynia of right breast Rx: - MM Digital Diagnostic Unilat R; Future  2. Large breasts Rx: - Ambulatory referral to Plastic Surgery  3. Chronic right shoulder pain  4. Chronic midline low back pain with left-sided sciatica - has had epidural injections - managed by Ortho    Plan   Follow up in 2 weeks virtually  Orders Placed This Encounter  Procedures   MM Digital Diagnostic Unilat R    Standing Status:   Future    Standing Expiration Date:   01/19/2022    Order Specific Question:   Reason for Exam (SYMPTOM  OR DIAGNOSIS REQUIRED)    Answer:   Right breast pain    Order Specific Question:   Is the patient pregnant?    Answer:   No    Order Specific Question:   Preferred imaging location?    Answer:   [facility]   Ambulatory referral to Plastic Surgery    Referral Priority:   Routine    Referral Type:   Surgical    Referral Reason:   Specialty Services Required    Requested Specialty:   Plastic Surgery    Number of Visits Requested:   1    Shelly Bombard, MD 01/19/2021 4:53 PM

## 2021-01-19 NOTE — Progress Notes (Signed)
Pt c/o pain in R breast.  History of lumpectomies  Normal pap 11/25/20 Normal mammogram 08/31/20

## 2021-02-06 ENCOUNTER — Ambulatory Visit: Payer: BC Managed Care – PPO | Admitting: Obstetrics

## 2021-02-08 ENCOUNTER — Ambulatory Visit: Payer: BC Managed Care – PPO | Admitting: Obstetrics

## 2021-02-15 ENCOUNTER — Ambulatory Visit: Payer: BC Managed Care – PPO | Admitting: Obstetrics

## 2021-02-23 ENCOUNTER — Other Ambulatory Visit (HOSPITAL_BASED_OUTPATIENT_CLINIC_OR_DEPARTMENT_OTHER): Payer: Self-pay

## 2021-02-24 ENCOUNTER — Other Ambulatory Visit: Payer: Self-pay

## 2021-02-24 ENCOUNTER — Encounter: Payer: Self-pay | Admitting: Obstetrics

## 2021-02-24 ENCOUNTER — Other Ambulatory Visit (HOSPITAL_COMMUNITY)
Admission: RE | Admit: 2021-02-24 | Discharge: 2021-02-24 | Disposition: A | Discharge status: Home / Self Care | Payer: BC Managed Care – PPO | Source: Ambulatory Visit | Attending: Obstetrics | Admitting: Obstetrics

## 2021-02-24 ENCOUNTER — Ambulatory Visit: Payer: BC Managed Care – PPO | Admitting: Obstetrics

## 2021-02-24 VITALS — BP 129/82 | HR 80 | Ht 63.0 in | Wt 164.0 lb

## 2021-02-24 DIAGNOSIS — Z113 Encounter for screening for infections with a predominantly sexual mode of transmission: Secondary | ICD-10-CM | POA: Diagnosis not present

## 2021-02-24 DIAGNOSIS — Z630 Problems in relationship with spouse or partner: Secondary | ICD-10-CM | POA: Diagnosis not present

## 2021-02-24 DIAGNOSIS — N898 Other specified noninflammatory disorders of vagina: Secondary | ICD-10-CM

## 2021-02-24 DIAGNOSIS — N939 Abnormal uterine and vaginal bleeding, unspecified: Secondary | ICD-10-CM | POA: Diagnosis not present

## 2021-02-24 LAB — POCT URINALYSIS DIPSTICK
Bilirubin, UA: NEGATIVE
Glucose, UA: NEGATIVE
Ketones, UA: NEGATIVE
Leukocytes, UA: NEGATIVE
Nitrite, UA: NEGATIVE
Protein, UA: NEGATIVE
Spec Grav, UA: 1.015 (ref 1.010–1.025)
Urobilinogen, UA: 0.2 E.U./dL
pH, UA: 6 (ref 5.0–8.0)

## 2021-02-24 LAB — POCT URINE PREGNANCY: Preg Test, Ur: NEGATIVE

## 2021-02-24 NOTE — Progress Notes (Signed)
Patient ID: Whitney Williams, female   DOB: December 29, 1965, 56 y.o.   MRN: [id number]   HPI Whitney Williams is a 55 y.o. female.  Complains os irregular vaginal bleeding and vaginal discharge.  Suspects partner has been cheating on her. HPI  Past Medical History:  Diagnosis Date   Asthma     Past Surgical History:  Procedure Laterality Date   BREAST LUMPECTOMY Right 2003 and 2004   x 2   WRIST GANGLION EXCISION     x 5    Family History  Problem Relation Age of Onset   Breast cancer Other        maternal cousins x 2    Social History Social History   Tobacco Use   Smoking status: Former    Packs/day: 0.25    Types: Cigarettes    Quit date: 10/17/2007    Years since quitting: 13.3   Smokeless tobacco: Never  Vaping Use   Vaping Use: Never used  Substance Use Topics   Alcohol use: No   Drug use: No    Allergies  Allergen Reactions   Ibuprofen Shortness Of Breath and Swelling    edema    Current Outpatient Medications  Medication Sig Dispense Refill   loratadine (CLARITIN) 10 MG tablet Take 1 tablet (10 mg total) by mouth daily. 30 tablet 11   Prenatal Vit-Fe Fumarate-FA (PNV PRENATAL PLUS MULTIVITAMIN) 27-1 MG TABS TAKE ONE TABLET BY MOUTH ONCE DAILY BEFORE BREAKFAST 90 tablet 4   acetaminophen-codeine (TYLENOL #3) 300-30 MG per tablet Take 1-2 tablets by mouth every 6 (six) hours as needed for moderate pain. (Patient not taking: Reported on 01/19/2021) 20 tablet 0   azithromycin (ZITHROMAX Z-PAK) 250 MG tablet Use as directed (Patient not taking: Reported on 01/19/2021) 6 tablet 0   diphenhydrAMINE (BENADRYL) 25 MG tablet Take 25 mg by mouth every 6 (six) hours as needed. (Patient not taking: No sig reported)     EPINEPHrine 0.15 MG/0.15ML IJ injection Inject 0.15 mg into the muscle as needed for anaphylaxis. (Patient not taking: Reported on 01/19/2021)     HYDROcodone-acetaminophen (NORCO/VICODIN) 5-325 MG tablet Take 1-2 tablets by mouth every 6 (six) hours  as needed for moderate pain or severe pain. (Patient not taking: Reported on 01/19/2021) 15 tablet 0   loratadine (CLARITIN) 10 MG tablet Take 1 tablet (10 mg total) by mouth daily. (Patient not taking: Reported on 02/24/2021) 30 tablet 11   methocarbamol (ROBAXIN) 500 MG tablet Take 1 tablet (500 mg total) by mouth every 8 (eight) hours as needed for muscle spasms. (Patient not taking: Reported on 01/19/2021) 20 tablet 0   metroNIDAZOLE (FLAGYL) 500 MG tablet Take 1 tablet (500 mg total) by mouth 2 (two) times daily. (Patient not taking: No sig reported) 14 tablet 2   Norethin Ace-Eth Estrad-FE (TAYTULLA) 1-20 MG-MCG(24) CAPS Take 1 capsule by mouth daily. (Patient not taking: Reported on 01/19/2021) 28 capsule 11   Prenatal Vit-Fe Fumarate-FA (PRENATAL VITAMIN PLUS LOW IRON) 27-1 MG TABS TAKE ONE TABLET BY MOUTH ONCE DAILY BEFORE BREAKFAST (Patient not taking: Reported on 01/19/2021) 90 tablet 4   tinidazole (TINDAMAX) 500 MG tablet Take 2 tablets (1,000 mg total) by mouth daily with breakfast. (Patient not taking: Reported on 01/19/2021) 10 tablet 2   No current facility-administered medications for this visit.    Review of Systems Review of Systems Constitutional: negative for fatigue and weight loss Respiratory: negative for cough and wheezing Cardiovascular: negative for chest pain, fatigue and palpitations  Gastrointestinal: negative for abdominal pain and change in bowel habits Genitourinary: positive for irregular vaginal bleeding and vaginal discharge Integument/breast: negative for nipple discharge Musculoskeletal:negative for myalgias Neurological: negative for gait problems and tremors Behavioral/Psych: negative for abusive relationship, depression Endocrine: negative for temperature intolerance      Blood pressure 129/82, pulse 80, height '5\' 3"'$  (1.6 m), weight 164 lb (74.4 kg), last menstrual period 10/10/2019.  Physical Exam Physical Exam General:   Alert and no distress  Skin:    no rash or abnormalities  Lungs:   clear to auscultation bilaterally  Heart:   regular rate and rhythm, S1, S2 normal, no murmur, click, rub or gallop  Breasts:   Not examined  Abdomen:  normal findings: no organomegaly, soft, non-tender and no hernia  Pelvis:  External genitalia: normal general appearance Urinary system: urethral meatus normal and bladder without fullness, nontender Vaginal: normal without tenderness, induration or masses Cervix: normal appearance Adnexa: normal bimanual exam Uterus: anteverted and non-tender, normal size    I have spent a total of 20 minutes of face-to-face time, excluding clinical staff time, reviewing notes and preparing to see patient, ordering tests and/or medications, and counseling the patient.   Data Reviewed Wet Prep UPT  Assessment      1. Vaginal discharge Rx: - Cervicovaginal ancillary only  2. Screening for STDs (sexually transmitted diseases) Rx: - Hepatitis B surface antigen - Hepatitis C antibody - HIV Antibody (routine testing w rflx) - RPR  3. Partner relationship problem - suspect partner is cheating, and may be putting her at risk for an STD   Plan   Follow up prn  Orders Placed This Encounter  Procedures   Hepatitis B surface antigen   Hepatitis C antibody   HIV Antibody (routine testing w rflx)   RPR     Shelly Bombard, MD 02/24/2021 9:41 AM

## 2021-02-24 NOTE — Progress Notes (Signed)
Pt here for routine STD screen, requesting STD blood work and vaginal swab

## 2021-02-24 NOTE — Addendum Note (Signed)
Addended by: Baltazar Najjar A on: 02/24/2021 11:19 AM   Modules accepted: Orders

## 2021-02-24 NOTE — Addendum Note (Signed)
Addended by: Courtney Heys on: 02/24/2021 11:22 AM   Modules accepted: Orders

## 2021-02-27 ENCOUNTER — Other Ambulatory Visit: Payer: Self-pay | Admitting: Obstetrics

## 2021-02-27 DIAGNOSIS — B3731 Acute candidiasis of vulva and vagina: Secondary | ICD-10-CM

## 2021-02-27 DIAGNOSIS — B373 Candidiasis of vulva and vagina: Secondary | ICD-10-CM

## 2021-02-27 LAB — CERVICOVAGINAL ANCILLARY ONLY
Bacterial Vaginitis (gardnerella): NEGATIVE
Candida Glabrata: NEGATIVE
Candida Vaginitis: POSITIVE — AB
Chlamydia: NEGATIVE
Comment: NEGATIVE
Comment: NEGATIVE
Comment: NEGATIVE
Comment: NEGATIVE
Comment: NEGATIVE
Comment: NORMAL
Neisseria Gonorrhea: NEGATIVE
Trichomonas: NEGATIVE

## 2021-02-27 MED ORDER — FLUCONAZOLE 150 MG PO TABS: 150.0000 mg | ORAL_TABLET | Freq: Once | ORAL | 0 refills | Status: AC

## 2021-02-28 ENCOUNTER — Other Ambulatory Visit: Payer: Self-pay

## 2021-02-28 ENCOUNTER — Ambulatory Visit
Admission: RE | Admit: 2021-02-28 | Discharge: 2021-02-28 | Disposition: A | Discharge status: Home / Self Care | Payer: BC Managed Care – PPO | Source: Ambulatory Visit | Attending: Obstetrics | Admitting: Obstetrics

## 2021-02-28 DIAGNOSIS — N644 Mastodynia: Secondary | ICD-10-CM

## 2021-03-01 LAB — URINE CULTURE

## 2021-03-02 LAB — HEPATITIS B SURFACE ANTIGEN: Hepatitis B Surface Ag: NEGATIVE

## 2021-03-02 LAB — RPR: RPR Ser Ql: NONREACTIVE

## 2021-03-02 LAB — HEPATITIS C ANTIBODY: Hep C Virus Ab: <0.1 s/co ratio (ref 0.0–0.9)

## 2021-03-02 LAB — HIV ANTIBODY (ROUTINE TESTING W REFLEX): HIV Screen 4th Generation wRfx: NONREACTIVE

## 2021-03-23 ENCOUNTER — Other Ambulatory Visit (HOSPITAL_BASED_OUTPATIENT_CLINIC_OR_DEPARTMENT_OTHER): Payer: Self-pay

## 2021-04-28 ENCOUNTER — Other Ambulatory Visit (HOSPITAL_BASED_OUTPATIENT_CLINIC_OR_DEPARTMENT_OTHER): Payer: Self-pay

## 2021-08-10 ENCOUNTER — Other Ambulatory Visit: Payer: Self-pay | Admitting: Obstetrics

## 2021-08-10 DIAGNOSIS — Z1231 Encounter for screening mammogram for malignant neoplasm of breast: Secondary | ICD-10-CM

## 2021-08-21 ENCOUNTER — Ambulatory Visit: Payer: BC Managed Care – PPO

## 2021-09-12 ENCOUNTER — Ambulatory Visit
Admission: RE | Admit: 2021-09-12 | Discharge: 2021-09-12 | Disposition: A | Discharge status: Home / Self Care | Payer: BC Managed Care – PPO | Source: Ambulatory Visit | Attending: Obstetrics | Admitting: Obstetrics

## 2021-09-12 DIAGNOSIS — Z1231 Encounter for screening mammogram for malignant neoplasm of breast: Secondary | ICD-10-CM

## 2022-04-06 ENCOUNTER — Ambulatory Visit (INDEPENDENT_AMBULATORY_CARE_PROVIDER_SITE_OTHER): Payer: BC Managed Care – PPO | Admitting: Obstetrics and Gynecology

## 2022-04-06 ENCOUNTER — Encounter: Payer: Self-pay | Admitting: Obstetrics and Gynecology

## 2022-04-06 ENCOUNTER — Other Ambulatory Visit (HOSPITAL_COMMUNITY)
Admission: RE | Admit: 2022-04-06 | Discharge: 2022-04-06 | Disposition: A | Discharge status: Home / Self Care | Payer: BC Managed Care – PPO | Source: Ambulatory Visit | Attending: Student | Admitting: Student

## 2022-04-06 VITALS — BP 128/83 | HR 67 | Ht 63.0 in | Wt 165.0 lb

## 2022-04-06 DIAGNOSIS — Z01419 Encounter for gynecological examination (general) (routine) without abnormal findings: Secondary | ICD-10-CM | POA: Insufficient documentation

## 2022-04-06 MED ORDER — PRENATAL VITAMIN PLUS LOW IRON 27-1 MG PO TABS: ORAL_TABLET | ORAL | 12 refills | Status: DC

## 2022-04-06 NOTE — Progress Notes (Signed)
ANNUAL EXAM Patient name: Whitney Williams MRN [id number]  Date of birth: 10/01/1965 Chief Complaint:   Gynecologic Exam  History of Present Illness:   Whitney Williams is a 56 y.o. [phone]  being seen today for a routine annual exam. Would like to have STI screening due to concern of partner having other sexual partners.  Reports having intermittent breast pain, no change in the last year.  Would like all testing available today.  Currently has intermittent menses, no abnormal bleeding or vaginal discharge.  Partner is interested in getting pregnant.  She would like to continue her prenatal vitamins to help with her hair growth.  Also reports feeling a bump in the perineal area, noticed only when standing  Patient's last menstrual period was 02/20/2022.   The pregnancy intention screening data noted above was reviewed. Potential methods of contraception were discussed. The patient elected to proceed with No data recorded.   Last pap 11/2020. Results were: NILM w/ HRHPV negative. H/O abnormal pap: no Last mammogram: 09/2021. Results were: normal. Family h/o breast cancer: no      04/06/2022    9:21 AM  Depression screen PHQ 2/9  Decreased Interest 0  Down, Depressed, Hopeless 0  PHQ - 2 Score 0  Altered sleeping 0  Tired, decreased energy 0  Change in appetite 0  Feeling bad or failure about yourself  0  Trouble concentrating 0  Moving slowly or fidgety/restless 0  Suicidal thoughts 0  PHQ-9 Score 0  Difficult doing work/chores Not difficult at all         No data to display           Review of Systems:   Pertinent items are noted in HPI Denies any headaches, blurred vision, fatigue, shortness of breath, chest pain, abdominal pain, abnormal vaginal discharge/itching/odor/irritation, problems with periods, bowel movements, urination, or intercourse unless otherwise stated above. Pertinent History Reviewed:  Reviewed past medical,surgical, social and family history.   Reviewed problem list, medications and allergies. Physical Assessment:   Vitals:   04/06/22 0815  BP: 128/83  Pulse: 67  Weight: 165 lb (74.8 kg)  Height: '5\' 3"'$  (1.6 m)  Body mass index is 29.23 kg/m.        Physical Examination:   General appearance - well appearing, and in no distress  Mental status - alert, oriented to person, place, and time  Psych:  She has a normal mood and affect  Skin - warm and dry, normal color, no suspicious lesions noted  Chest - effort normal, all lung fields clear to auscultation bilaterally  Heart - normal rate and regular rhythm  Neck:  midline trachea, no thyromegaly or nodules  Breasts - breasts appear normal, no suspicious masses, no skin or nipple changes or  axillary nodes  Abdomen - soft, nontender, nondistended, no masses or organomegaly  Pelvic - VULVA: normal appearing vulva with no masses, tenderness or lesions  VAGINA: normal appearing vagina with normal color and discharge, no lesions  CERVIX: normal appearing cervix without discharge or lesions, no CMT. No bump, palpated ischial tuberosity   Thin prep pap is done with HR HPV cotesting  UTERUS: uterus is felt to be normal size, shape, consistency and nontender   ADNEXA: No adnexal masses or tenderness noted.  Extremities:  No swelling or varicosities noted  Chaperone present for exam  No results found for this or any previous visit (from the past 24 hour(s)).  Assessment & Plan:  1. Well woman exam  with routine gynecological exam Screening completed Discussed spontaneous pregnancy highly unlikely at this age and that attempted pregnancy at this age is not advised  - Cervicovaginal ancillary only( [place]) - Prenatal Vit-Fe Fumarate-FA (PRENATAL VITAMIN PLUS LOW IRON) 27-1 MG TABS; TAKE ONE TABLET BY MOUTH ONCE DAILY BEFORE BREAKFAST  Dispense: 90 tablet; Refill: 12 - Hepatitis C Antibody - Hepatitis B Surface AntiGEN - HIV antibody (with reflex) - RPR - Cytology - PAP( [facility])   Orders Placed This Encounter  Procedures   Hepatitis C Antibody   Hepatitis B Surface AntiGEN   HIV antibody (with reflex)   RPR    Meds:  Meds ordered this encounter  Medications   Prenatal Vit-Fe Fumarate-FA (PRENATAL VITAMIN PLUS LOW IRON) 27-1 MG TABS    Sig: TAKE ONE TABLET BY MOUTH ONCE DAILY BEFORE BREAKFAST    Dispense:  90 tablet    Refill:  12    Follow-up: Return in about 1 year (around 04/07/2023) for Annual GYN.  Darliss Cheney, MD 04/06/2022 11:07 AM

## 2022-04-06 NOTE — Progress Notes (Signed)
56 y.o GYN presents for AEX/PAP/STD Screening.  Last PAP 11/25/2020 Last Mammogram 09/12/2021

## 2022-04-07 LAB — HEPATITIS B SURFACE ANTIGEN: Hepatitis B Surface Ag: NEGATIVE

## 2022-04-07 LAB — HIV ANTIBODY (ROUTINE TESTING W REFLEX): HIV Screen 4th Generation wRfx: NONREACTIVE

## 2022-04-07 LAB — HEPATITIS C ANTIBODY: Hep C Virus Ab: NONREACTIVE

## 2022-04-07 LAB — RPR: RPR Ser Ql: NONREACTIVE

## 2022-04-09 LAB — CERVICOVAGINAL ANCILLARY ONLY
Bacterial Vaginitis (gardnerella): NEGATIVE
Candida Glabrata: NEGATIVE
Candida Vaginitis: NEGATIVE
Chlamydia: NEGATIVE
Comment: NEGATIVE
Comment: NEGATIVE
Comment: NEGATIVE
Comment: NEGATIVE
Comment: NEGATIVE
Comment: NORMAL
Neisseria Gonorrhea: NEGATIVE
Trichomonas: NEGATIVE

## 2022-04-10 LAB — CYTOLOGY - PAP
Comment: NEGATIVE
Diagnosis: NEGATIVE
High risk HPV: NEGATIVE

## 2022-06-26 ENCOUNTER — Other Ambulatory Visit (HOSPITAL_COMMUNITY)
Admission: RE | Admit: 2022-06-26 | Discharge: 2022-06-26 | Disposition: A | Discharge status: Home / Self Care | Payer: BC Managed Care – PPO | Source: Ambulatory Visit | Attending: Obstetrics | Admitting: Obstetrics

## 2022-06-26 ENCOUNTER — Encounter: Payer: Self-pay | Admitting: Obstetrics

## 2022-06-26 ENCOUNTER — Ambulatory Visit (INDEPENDENT_AMBULATORY_CARE_PROVIDER_SITE_OTHER): Payer: BC Managed Care – PPO | Admitting: Obstetrics

## 2022-06-26 VITALS — BP 134/86 | HR 79 | Ht 63.0 in | Wt 155.2 lb

## 2022-06-26 DIAGNOSIS — Z113 Encounter for screening for infections with a predominantly sexual mode of transmission: Secondary | ICD-10-CM | POA: Insufficient documentation

## 2022-06-26 DIAGNOSIS — Z202 Contact with and (suspected) exposure to infections with a predominantly sexual mode of transmission: Secondary | ICD-10-CM

## 2022-06-26 NOTE — Progress Notes (Signed)
Pt presents for STD testing. Denies symptoms. Possible exposure.

## 2022-06-26 NOTE — Progress Notes (Signed)
Patient ID: CAPRICIA SERDA, female   DOB: 1966-02-17, 57 y.o.   MRN: [id number]  Chief Complaint  Patient presents with   Routine Prenatal Visit    HPI ANIESA BOBACK is a 57 y.o. female.  Complains of partner possibly exposing her to an STD due to infidelity.  Denies vaginal discharge, pelvic pain or dysuria. HPI  Past Medical History:  Diagnosis Date   Asthma     Past Surgical History:  Procedure Laterality Date   BREAST EXCISIONAL BIOPSY Right    2003,2004   WRIST GANGLION EXCISION     x 5    Family History  Problem Relation Age of Onset   Breast cancer Cousin     Social History Social History   Tobacco Use   Smoking status: Former    Packs/day: 0.25    Types: Cigarettes    Quit date: 10/17/2007    Years since quitting: 14.7   Smokeless tobacco: Never  Vaping Use   Vaping Use: Never used  Substance Use Topics   Alcohol use: No   Drug use: No    Allergies  Allergen Reactions   Ibuprofen Shortness Of Breath and Swelling    edema    Current Outpatient Medications  Medication Sig Dispense Refill   loratadine (CLARITIN) 10 MG tablet Take 1 tablet (10 mg total) by mouth daily. 30 tablet 11   Prenatal Vit-Fe Fumarate-FA (PRENATAL VITAMIN PLUS LOW IRON) 27-1 MG TABS TAKE ONE TABLET BY MOUTH ONCE DAILY BEFORE BREAKFAST 90 tablet 12   acetaminophen-codeine (TYLENOL #3) 300-30 MG per tablet Take 1-2 tablets by mouth every 6 (six) hours as needed for moderate pain. (Patient not taking: Reported on 01/19/2021) 20 tablet 0   azithromycin (ZITHROMAX Z-PAK) 250 MG tablet Use as directed (Patient not taking: Reported on 01/19/2021) 6 tablet 0   diphenhydrAMINE (BENADRYL) 25 MG tablet Take 25 mg by mouth every 6 (six) hours as needed. (Patient not taking: Reported on 11/25/2020)     EPINEPHrine 0.15 MG/0.15ML IJ injection Inject 0.15 mg into the muscle as needed for anaphylaxis. (Patient not taking: Reported on 01/19/2021)     HYDROcodone-acetaminophen (NORCO/VICODIN) 5-325  MG tablet Take 1-2 tablets by mouth every 6 (six) hours as needed for moderate pain or severe pain. (Patient not taking: Reported on 01/19/2021) 15 tablet 0   loratadine (CLARITIN) 10 MG tablet Take 1 tablet (10 mg total) by mouth daily. (Patient not taking: Reported on 02/24/2021) 30 tablet 11   methocarbamol (ROBAXIN) 500 MG tablet Take 1 tablet (500 mg total) by mouth every 8 (eight) hours as needed for muscle spasms. (Patient not taking: Reported on 01/19/2021) 20 tablet 0   metroNIDAZOLE (FLAGYL) 500 MG tablet Take 1 tablet (500 mg total) by mouth 2 (two) times daily. (Patient not taking: Reported on 05/06/2017) 14 tablet 2   Norethin Ace-Eth Estrad-FE (TAYTULLA) 1-20 MG-MCG(24) CAPS Take 1 capsule by mouth daily. (Patient not taking: Reported on 01/19/2021) 28 capsule 11   Prenatal Vit-Fe Fumarate-FA (PNV PRENATAL PLUS MULTIVITAMIN) 27-1 MG TABS TAKE ONE TABLET BY MOUTH ONCE DAILY BEFORE BREAKFAST (Patient not taking: Reported on 06/26/2022) 90 tablet 4   tinidazole (TINDAMAX) 500 MG tablet Take 2 tablets (1,000 mg total) by mouth daily with breakfast. (Patient not taking: Reported on 01/19/2021) 10 tablet 2   No current facility-administered medications for this visit.    Review of Systems Review of Systems Constitutional: negative for fatigue and weight loss Respiratory: negative for cough and wheezing Cardiovascular: negative for  chest pain, fatigue and palpitations Gastrointestinal: negative for abdominal pain and change in bowel habits Genitourinary:negative Integument/breast: negative for nipple discharge Musculoskeletal:negative for myalgias Neurological: negative for gait problems and tremors Behavioral/Psych: negative for abusive relationship, depression Endocrine: negative for temperature intolerance      Blood pressure 134/86, pulse 79, height '5\' 3"'$  (1.6 m), weight 155 lb 3.2 oz (70.4 kg).  Physical Exam Physical Exam General:   Alert and no distress  Skin:   no rash or  abnormalities  Lungs:   clear to auscultation bilaterally  Heart:   regular rate and rhythm, S1, S2 normal, no murmur, click, rub or gallop  Breasts:   Not examined  Abdomen:  normal findings: no organomegaly, soft, non-tender and no hernia  Pelvis:  External genitalia: normal general appearance Urinary system: urethral meatus normal and bladder without fullness, nontender Vaginal: normal without tenderness, induration or masses Cervix: normal appearance Adnexa: normal bimanual exam Uterus: anteverted and non-tender, normal size    I have spent a total of 20 minutes of face-to-face time, excluding clinical staff time, reviewing notes and preparing to see patient, ordering tests and/or medications, and counseling the patient.   Data Reviewed Wet Prep  Assessment     1. Possible exposure to STD  2. Screening for STDs (sexually transmitted diseases) Rx: - Cervicovaginal ancillary only( [place]) - HIV antibody (with reflex) - RPR - Hepatitis C Antibody - Hepatitis B Surface AntiGEN     Plan   Follow up prn  Orders Placed This Encounter  Procedures   HIV antibody (with reflex)   RPR   Hepatitis C Antibody   Hepatitis B Surface AntiGEN     Myran Arcia A. Jodi Mourning MD 06/26/2022

## 2022-06-27 LAB — CERVICOVAGINAL ANCILLARY ONLY
Bacterial Vaginitis (gardnerella): NEGATIVE
Candida Glabrata: NEGATIVE
Candida Vaginitis: NEGATIVE
Chlamydia: NEGATIVE
Comment: NEGATIVE
Comment: NEGATIVE
Comment: NEGATIVE
Comment: NEGATIVE
Comment: NEGATIVE
Comment: NORMAL
Neisseria Gonorrhea: NEGATIVE
Trichomonas: NEGATIVE

## 2022-06-27 LAB — RPR: RPR Ser Ql: NONREACTIVE

## 2022-06-27 LAB — HEPATITIS C ANTIBODY: Hep C Virus Ab: NONREACTIVE

## 2022-06-27 LAB — HIV ANTIBODY (ROUTINE TESTING W REFLEX): HIV Screen 4th Generation wRfx: NONREACTIVE

## 2022-06-27 LAB — HEPATITIS B SURFACE ANTIGEN: Hepatitis B Surface Ag: NEGATIVE

## 2022-07-04 ENCOUNTER — Other Ambulatory Visit: Payer: Self-pay | Admitting: Obstetrics

## 2022-07-04 DIAGNOSIS — Z1231 Encounter for screening mammogram for malignant neoplasm of breast: Secondary | ICD-10-CM

## 2022-09-17 ENCOUNTER — Ambulatory Visit
Admission: RE | Admit: 2022-09-17 | Discharge: 2022-09-17 | Disposition: A | Discharge status: Home / Self Care | Payer: BC Managed Care – PPO | Source: Ambulatory Visit | Attending: Obstetrics | Admitting: Obstetrics

## 2022-09-17 DIAGNOSIS — Z1231 Encounter for screening mammogram for malignant neoplasm of breast: Secondary | ICD-10-CM

## 2022-12-14 IMAGING — MG MM DIGITAL SCREENING BILAT W/ TOMO AND CAD
8 series · 8 of 24 positions shown · non-contrast
Comparison: Previous exam(s).

CLINICAL DATA: Screening.

EXAM:
DIGITAL SCREENING BILATERAL MAMMOGRAM WITH TOMOSYNTHESIS AND CAD
TECHNIQUE: Bilateral screening digital craniocaudal and mediolateral oblique
mammograms were obtained. Bilateral screening digital breast
tomosynthesis was performed. The images were evaluated with
computer-aided detection.

[R CC synth-2D]
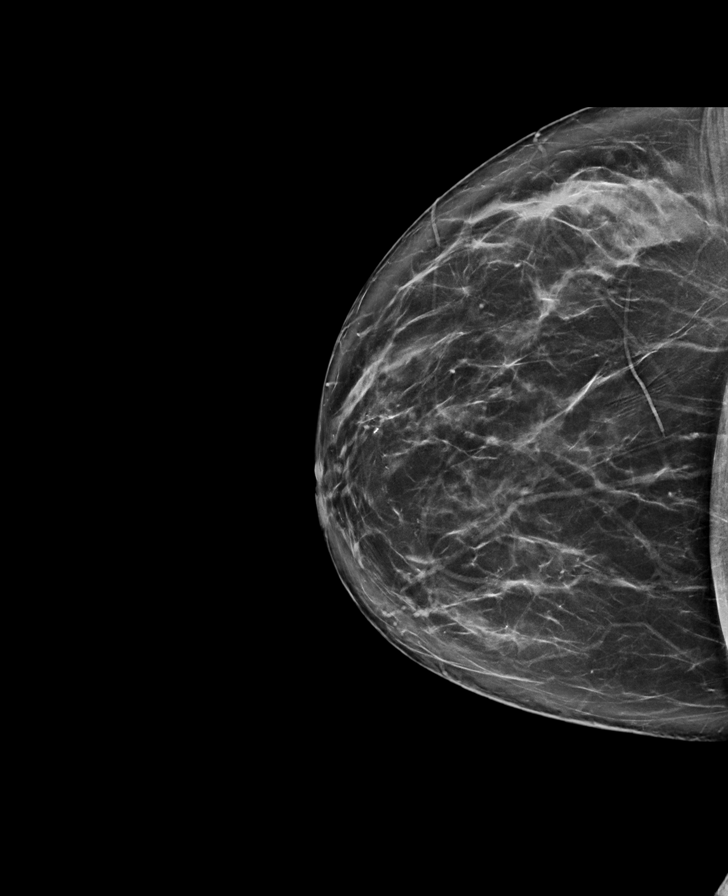

[L CC synth-2D]
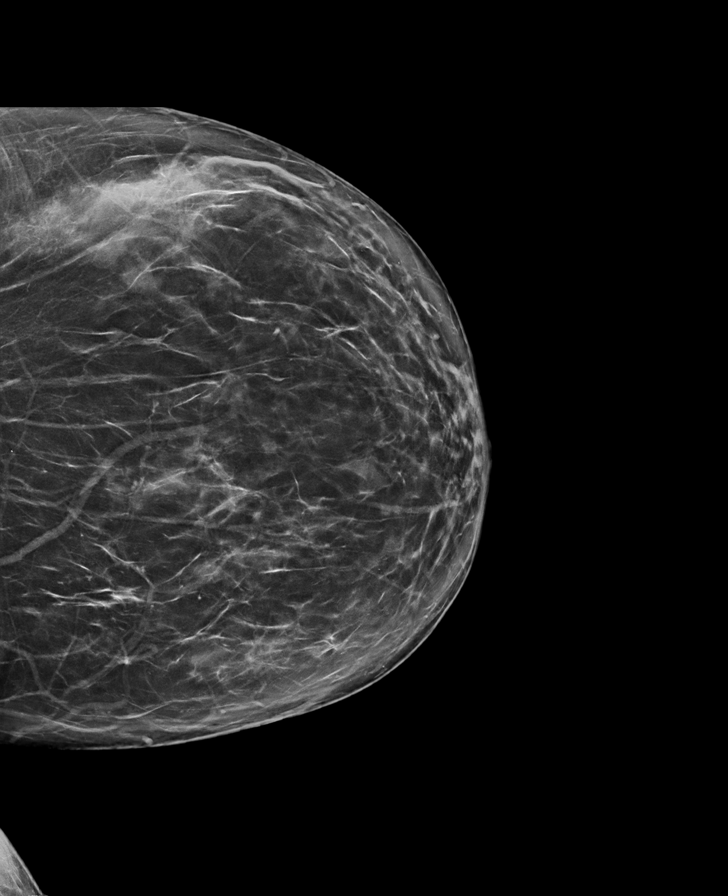

[L MLO synth-2D]
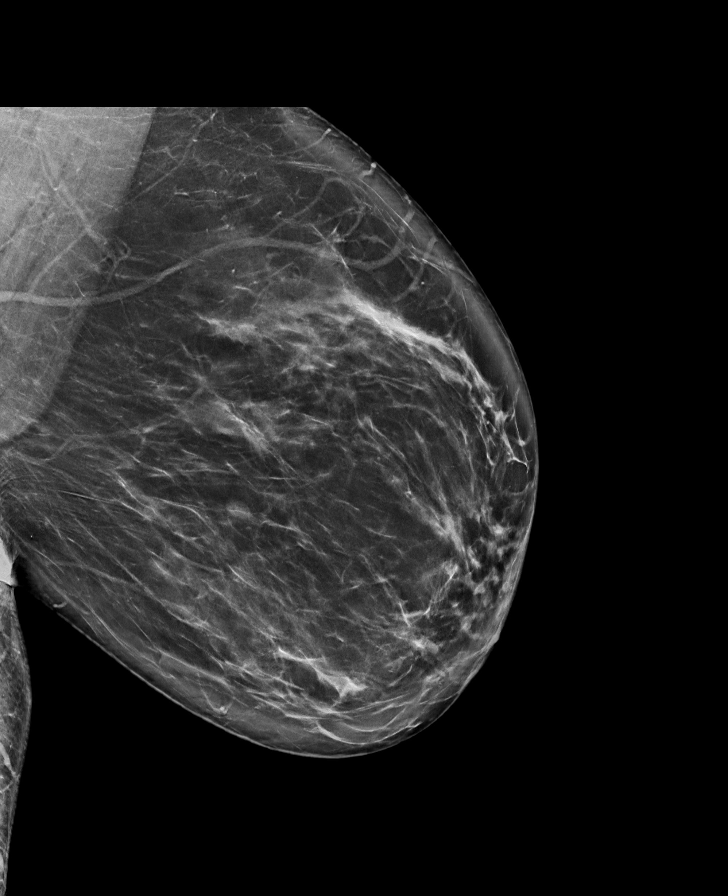

[R MLO synth-2D]
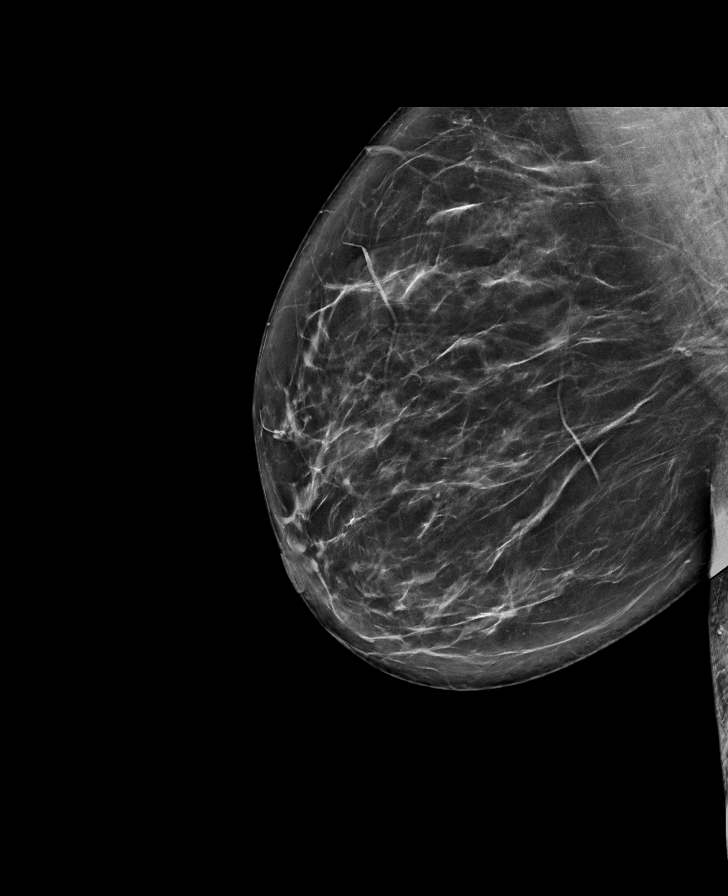

[R CC tomo · tomo slice 46/91.0]
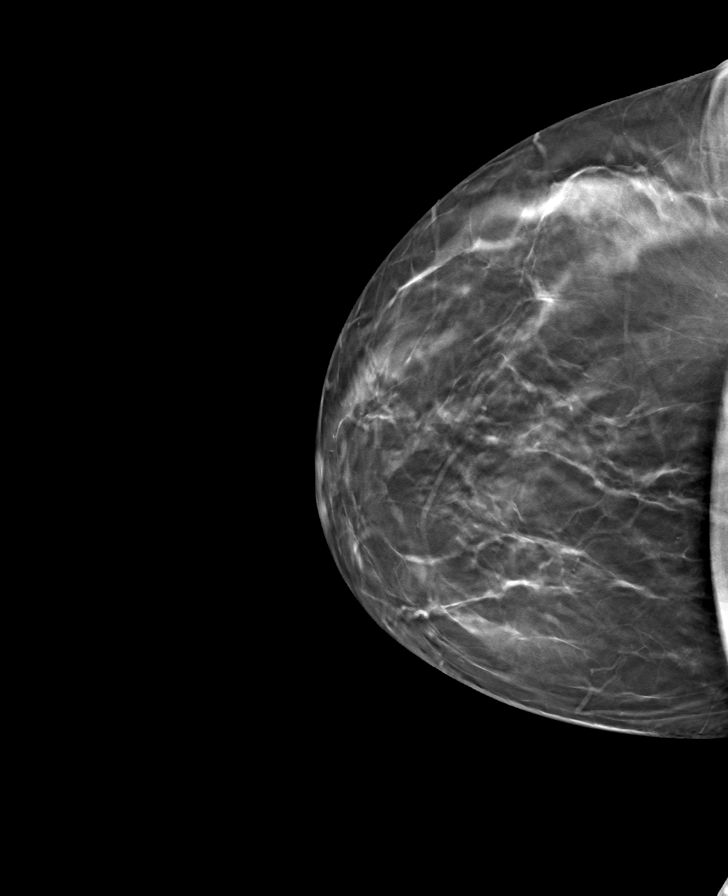

[R MLO tomo · tomo slice 47/94.0]
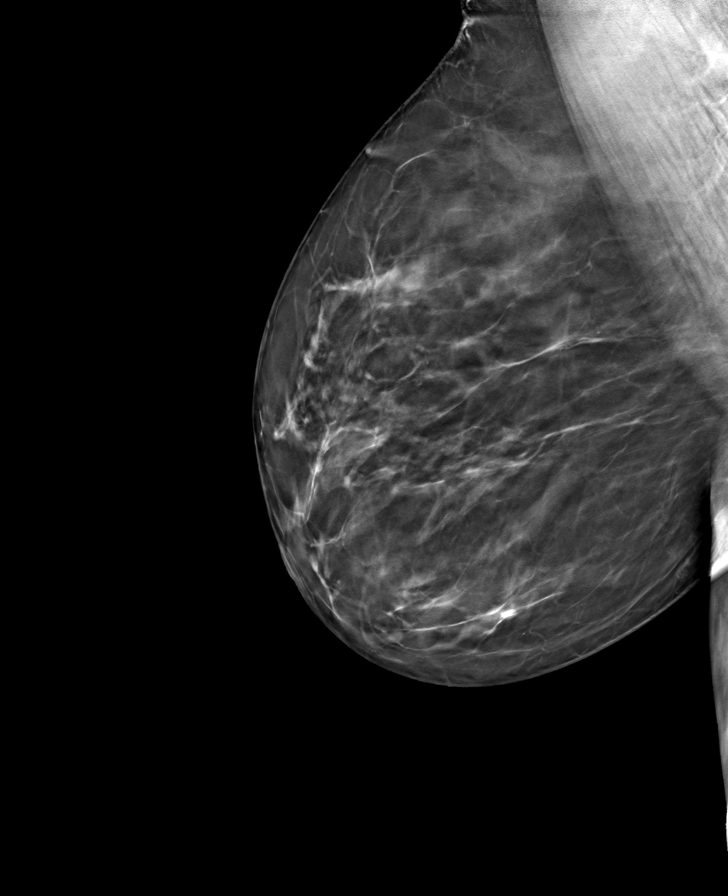

[L CC tomo · tomo slice 44/87.0]
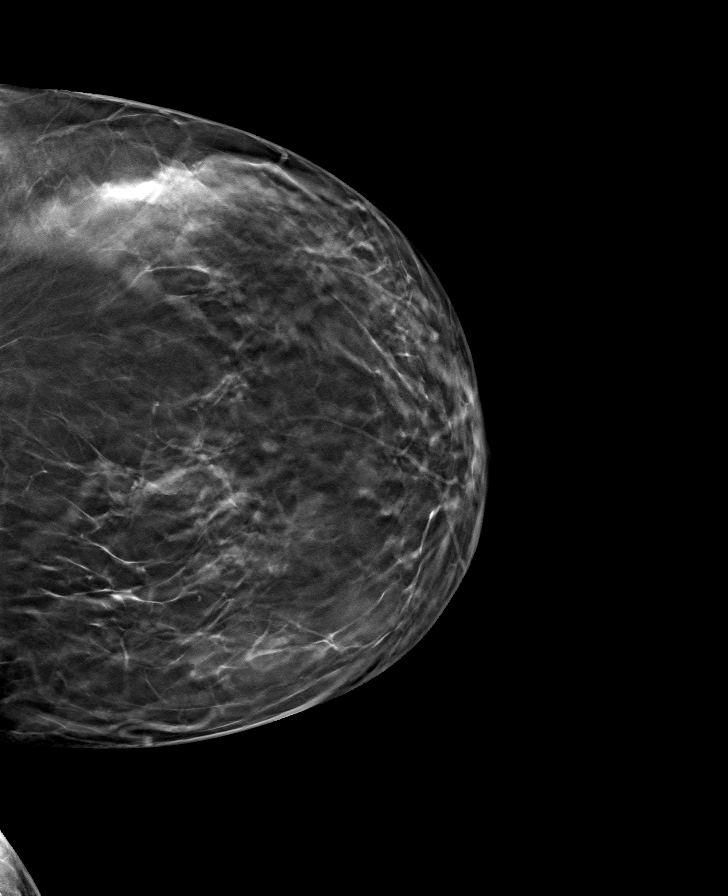

[L MLO tomo · tomo slice 49/96.0]
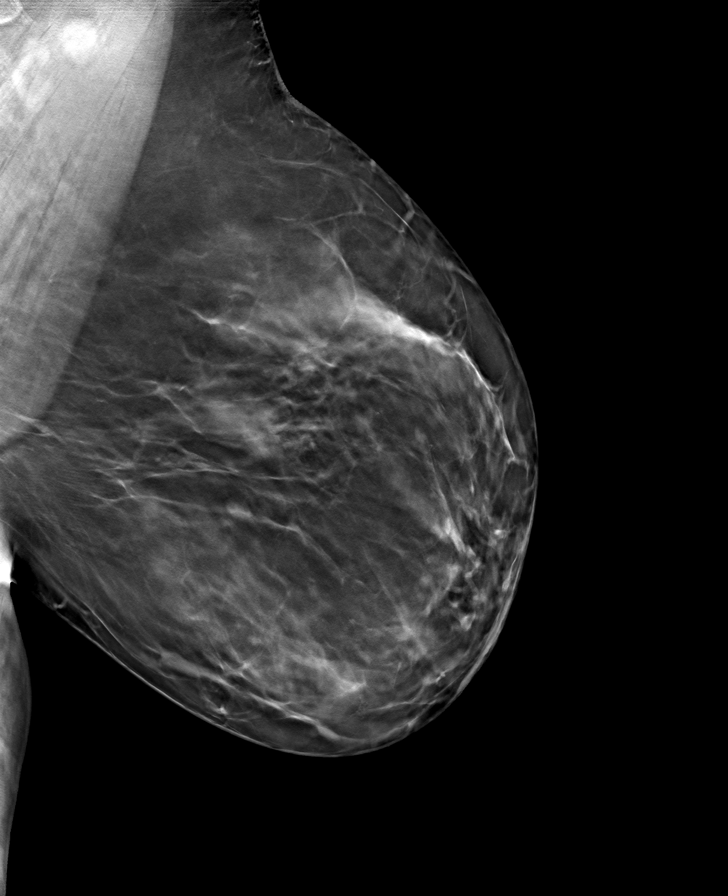

[8 of 24 positions shown; findings below may reference images not displayed]

ACR Breast Density Category b: There are scattered areas of
fibroglandular density.
FINDINGS: There are no findings suspicious for malignancy.
IMPRESSION: No mammographic evidence of malignancy. A result letter of this
screening mammogram will be mailed directly to the patient.

RECOMMENDATION:
Screening mammogram in one year. (Code:51-O-LD2)

BI-RADS CATEGORY  1: Negative.

## 2023-01-30 ENCOUNTER — Encounter (HOSPITAL_BASED_OUTPATIENT_CLINIC_OR_DEPARTMENT_OTHER): Payer: Self-pay

## 2023-01-30 ENCOUNTER — Other Ambulatory Visit: Payer: Self-pay

## 2023-01-30 DIAGNOSIS — M79602 Pain in left arm: Secondary | ICD-10-CM | POA: Diagnosis not present

## 2023-01-30 DIAGNOSIS — R079 Chest pain, unspecified: Secondary | ICD-10-CM | POA: Diagnosis present

## 2023-01-30 DIAGNOSIS — M25512 Pain in left shoulder: Secondary | ICD-10-CM | POA: Insufficient documentation

## 2023-01-30 LAB — COMPREHENSIVE METABOLIC PANEL
ALT: 16 U/L (ref 0–44)
AST: 18 U/L (ref 15–41)
Albumin: 3.8 g/dL (ref 3.5–5.0)
Alkaline Phosphatase: 64 U/L (ref 38–126)
Anion gap: 11 (ref 5–15)
BUN: 14 mg/dL (ref 6–20)
CO2: 24 mmol/L (ref 22–32)
Calcium: 9.1 mg/dL (ref 8.9–10.3)
Chloride: 106 mmol/L (ref 98–111)
Creatinine, Ser: 0.88 mg/dL (ref 0.44–1.00)
GFR, Estimated: >60 mL/min (ref >60)
Glucose, Bld: 117 mg/dL — ABNORMAL HIGH (ref 70–99)
Potassium: 3.6 mmol/L (ref 3.5–5.1)
Sodium: 141 mmol/L (ref 135–145)
Total Bilirubin: 0.8 mg/dL (ref 0.3–1.2)
Total Protein: 7.5 g/dL (ref 6.5–8.1)

## 2023-01-30 LAB — CBC WITH DIFFERENTIAL/PLATELET
Abs Immature Granulocytes: 0.02 10*3/uL (ref 0.00–0.07)
Basophils Absolute: 0.1 10*3/uL (ref 0.0–0.1)
Basophils Relative: 1 %
Eosinophils Absolute: 0.4 10*3/uL (ref 0.0–0.5)
Eosinophils Relative: 4 %
HCT: 39.2 % (ref 36.0–46.0)
Hemoglobin: 12.2 g/dL (ref 12.0–15.0)
Immature Granulocytes: 0 %
Lymphocytes Relative: 40 %
Lymphs Abs: 3.6 10*3/uL (ref 0.7–4.0)
MCH: 21.4 pg — ABNORMAL LOW (ref 26.0–34.0)
MCHC: 31.1 g/dL (ref 30.0–36.0)
MCV: 68.9 fL — ABNORMAL LOW (ref 80.0–100.0)
Monocytes Absolute: 0.9 10*3/uL (ref 0.1–1.0)
Monocytes Relative: 10 %
Neutro Abs: 4.1 10*3/uL (ref 1.7–7.7)
Neutrophils Relative %: 45 %
Platelets: 297 10*3/uL (ref 150–400)
RBC: 5.69 MIL/uL — ABNORMAL HIGH (ref 3.87–5.11)
RDW: 16.7 % — ABNORMAL HIGH (ref 11.5–15.5)
WBC: 9.1 10*3/uL (ref 4.0–10.5)
nRBC: 0 % (ref 0.0–0.2)

## 2023-01-30 NOTE — ED Triage Notes (Addendum)
Pt states he has had chest pressure since yesterday, left arm pain x 1 week. Today left side of face is numb Can barely move left arm r/t pain, denies any injury

## 2023-01-31 ENCOUNTER — Emergency Department (HOSPITAL_BASED_OUTPATIENT_CLINIC_OR_DEPARTMENT_OTHER)
Admission: EM | Admit: 2023-01-31 | Discharge: 2023-01-31 | Disposition: A | Discharge status: Home / Self Care | Payer: BC Managed Care – PPO | Attending: Emergency Medicine | Admitting: Emergency Medicine

## 2023-01-31 ENCOUNTER — Emergency Department (HOSPITAL_BASED_OUTPATIENT_CLINIC_OR_DEPARTMENT_OTHER): Payer: BC Managed Care – PPO

## 2023-01-31 DIAGNOSIS — B9689 Other specified bacterial agents as the cause of diseases classified elsewhere: Secondary | ICD-10-CM

## 2023-01-31 DIAGNOSIS — M79602 Pain in left arm: Secondary | ICD-10-CM

## 2023-01-31 DIAGNOSIS — R079 Chest pain, unspecified: Secondary | ICD-10-CM

## 2023-01-31 LAB — TROPONIN I (HIGH SENSITIVITY): Troponin I (High Sensitivity): 2 ng/L (ref <18)

## 2023-01-31 MED ORDER — ACETAMINOPHEN 325 MG PO TABS
650.0000 mg | ORAL_TABLET | Freq: Once | ORAL | Status: AC
  Administered 2023-01-31: 650 mg via ORAL
  Filled 2023-01-31: qty 2

## 2023-01-31 MED ORDER — TIZANIDINE HCL 4 MG PO TABS: 4.0000 mg | ORAL_TABLET | Freq: Four times a day (QID) | ORAL | 0 refills | Status: AC | PRN

## 2023-01-31 NOTE — ED Notes (Signed)
Pt expressed frustration about ED staff letting pt sleep past DC time. Pt was assessed by primary nurse, charge nurse and EDP, pt was lethargic, slow to respond and fell asleep during conversation on multiple occasions. ED staff felt pt should sleep until she was able to wake up enough to drive home safely. Pt was DC after she was able to walk around ED and show proper coordination to drive.

## 2023-01-31 NOTE — ED Notes (Signed)
Pt appears lethargic and slow to respond upon assessment, denies taking medication at home

## 2023-01-31 NOTE — ED Provider Notes (Signed)
[facility]  Provider Note  CSN: [id number] Arrival date & time: 01/30/23 2308  History Chief Complaint  Patient presents with   Chest Pain    Whitney Williams is a 57 y.o. female with no reported PMH reports a week of L shoulder pain radiating into L arm, worse with movement. Today also had some chest pain. Called her PCP who advised her to come to the ED to rule out ?stroke. Denies any injury to arm. No prior history of same. No recent cough or fever. No recent travel or leg swelling.    Home Medications Prior to Admission medications   Medication Sig Start Date End Date Taking? Authorizing Provider  tiZANidine (ZANAFLEX) 4 MG tablet Take 1 tablet (4 mg total) by mouth every 6 (six) hours as needed for muscle spasms. 01/31/23  Yes Pollyann Savoy, MD     Allergies    Ibuprofen and Nsaids   Review of Systems   Review of Systems Please see HPI for pertinent positives and negatives  Physical Exam BP 109/76   Pulse 89   Temp 98 F (36.7 C)   Resp 14   Ht 5\' 3"  (1.6 m)   Wt 65.8 kg   SpO2 97%   BMI 25.69 kg/m   Physical Exam Vitals and nursing note reviewed.  Constitutional:      Appearance: Normal appearance.  HENT:     Head: Normocephalic and atraumatic.     Nose: Nose normal.     Mouth/Throat:     Mouth: Mucous membranes are moist.  Eyes:     Extraocular Movements: Extraocular movements intact.     Conjunctiva/sclera: Conjunctivae normal.  Cardiovascular:     Rate and Rhythm: Normal rate.     Pulses: Normal pulses.  Pulmonary:     Effort: Pulmonary effort is normal.     Breath sounds: Normal breath sounds.  Abdominal:     General: Abdomen is flat.     Palpations: Abdomen is soft.     Tenderness: There is no abdominal tenderness.  Musculoskeletal:        General: Tenderness (L shoulder) present. No swelling or deformity. Normal range of motion.     Cervical back: Neck supple.  Skin:    General: Skin  is warm and dry.  Neurological:     General: No focal deficit present.     Mental Status: She is alert.  Psychiatric:        Mood and Affect: Mood normal.     ED Results / Procedures / Treatments   EKG EKG Interpretation Date/Time:  Wednesday January 30 2023 23:17:40 EDT Ventricular Rate:  89 PR Interval:  143 QRS Duration:  85 QT Interval:  364 QTC Calculation: 443 R Axis:   65  Text Interpretation: Sinus rhythm Right atrial enlargement No old tracing to compare Confirmed by Susy Frizzle [phone]) on 01/31/2023 2:06:56 AM  Procedures Procedures  Medications Ordered in the ED Medications  acetaminophen (TYLENOL) tablet 650 mg (650 mg Oral Given 01/31/23 0206)    Initial Impression and Plan  Patient here primarily for L shoulder/arm pain ongoing for a week, also had some chest pains earlier in the day. She does not have any CAD risk factors. EKG is not ischemic. Labs done in triage show unremarkable CBC, BMP and Trop. Suspect her symptoms are MSK in origin. Will send for CXR, offered muscle relaxer but she declines Robaxin or Flexeril based on prior lack of  efficacy. Will try tizanidine.   ED Course   Clinical Course as of 01/31/23 0301  Thu Jan 31, 2023  0244 I personally viewed the images from radiology studies and agree with radiologist interpretation: CXR is clear, no bony abnormalities. Plan discharge with Rx for Tizanidine as above. PCP follow up, RTED for any other concerns.   [CS]    Clinical Course User Index [CS] Pollyann Savoy, MD     MDM Rules/Calculators/A&P Medical Decision Making Problems Addressed: Left arm pain: acute illness or injury Nonspecific chest pain: acute illness or injury  Amount and/or Complexity of Data Reviewed Labs: ordered. Decision-making details documented in ED Course. Radiology: ordered and independent interpretation performed. Decision-making details documented in ED Course. ECG/medicine tests: ordered and independent  interpretation performed. Decision-making details documented in ED Course.  Risk OTC drugs. Prescription drug management.     Final Clinical Impression(s) / ED Diagnoses Final diagnoses:  Left arm pain  Nonspecific chest pain    Rx / DC Orders ED Discharge Orders          Ordered    tiZANidine (ZANAFLEX) 4 MG tablet  Every 6 hours PRN        01/31/23 0246             Pollyann Savoy, MD 01/31/23 [phone]

## 2023-03-08 ENCOUNTER — Ambulatory Visit: Payer: BC Managed Care – PPO | Admitting: Obstetrics and Gynecology

## 2023-10-09 ENCOUNTER — Other Ambulatory Visit: Payer: Self-pay | Admitting: Obstetrics

## 2023-10-09 DIAGNOSIS — Z1231 Encounter for screening mammogram for malignant neoplasm of breast: Secondary | ICD-10-CM

## 2023-10-10 ENCOUNTER — Ambulatory Visit
Admission: RE | Admit: 2023-10-10 | Discharge: 2023-10-10 | Disposition: A | Discharge status: Home / Self Care | Payer: Self-pay | Source: Ambulatory Visit | Attending: Obstetrics | Admitting: Obstetrics

## 2023-10-10 DIAGNOSIS — Z1231 Encounter for screening mammogram for malignant neoplasm of breast: Secondary | ICD-10-CM

## 2023-10-16 ENCOUNTER — Encounter: Payer: Self-pay | Admitting: Family Medicine

## 2023-11-18 ENCOUNTER — Other Ambulatory Visit (HOSPITAL_COMMUNITY)
Admission: RE | Admit: 2023-11-18 | Discharge: 2023-11-18 | Disposition: A | Discharge status: Home / Self Care | Source: Ambulatory Visit | Attending: Obstetrics | Admitting: Obstetrics

## 2023-11-18 ENCOUNTER — Encounter: Payer: Self-pay | Admitting: Obstetrics

## 2023-11-18 ENCOUNTER — Ambulatory Visit (INDEPENDENT_AMBULATORY_CARE_PROVIDER_SITE_OTHER): Admitting: Obstetrics

## 2023-11-18 VITALS — BP 117/78 | HR 78 | Ht 63.0 in | Wt 154.2 lb

## 2023-11-18 DIAGNOSIS — Z113 Encounter for screening for infections with a predominantly sexual mode of transmission: Secondary | ICD-10-CM | POA: Diagnosis not present

## 2023-11-18 DIAGNOSIS — N898 Other specified noninflammatory disorders of vagina: Secondary | ICD-10-CM

## 2023-11-18 DIAGNOSIS — Z780 Asymptomatic menopausal state: Secondary | ICD-10-CM | POA: Diagnosis not present

## 2023-11-18 DIAGNOSIS — Z131 Encounter for screening for diabetes mellitus: Secondary | ICD-10-CM

## 2023-11-18 DIAGNOSIS — Z1239 Encounter for other screening for malignant neoplasm of breast: Secondary | ICD-10-CM

## 2023-11-18 DIAGNOSIS — Z01419 Encounter for gynecological examination (general) (routine) without abnormal findings: Secondary | ICD-10-CM | POA: Diagnosis present

## 2023-11-18 DIAGNOSIS — E569 Vitamin deficiency, unspecified: Secondary | ICD-10-CM

## 2023-11-18 MED ORDER — PNV-DHA 27-0.6-0.4-300 MG PO CAPS
1.0000 | ORAL_CAPSULE | Freq: Every day | ORAL | 3 refills | Status: AC
Start: 2023-11-18 — End: ?

## 2023-11-18 NOTE — Progress Notes (Unsigned)
 Subjective:        Whitney Williams is a 58 y.o. female here for a routine exam.  Current complaints: Vaginal discharge.    Personal health questionnaire:  Is patient Whitney Williams, have a family history of breast and/or ovarian cancer: yes Is there a family history of uterine cancer diagnosed at age < 54, gastrointestinal cancer, urinary tract cancer, family member who is a Personnel officer syndrome-associated carrier: no Is the patient overweight and hypertensive, family history of diabetes, personal history of gestational diabetes, preeclampsia or PCOS: no Is patient over 38, have PCOS,  family history of premature CHD under age 66, diabetes, smoke, have hypertension or peripheral artery disease:  no At any time, has a partner hit, kicked or otherwise hurt or frightened you?: no Over the past 2 weeks, have you felt down, depressed or hopeless?: no Over the past 2 weeks, have you felt little interest or pleasure in doing things?:no   Gynecologic History Patient's last menstrual period was 02/20/2022. Contraception: post menopausal status Last Pap: 04/06/2022. Results were: normal Last mammogram: 09/12/2021. Results were: normal  Obstetric History OB History  Gravida Para Term Preterm AB Living  3 3 3   3   SAB IAB Ectopic Multiple Live Births      3    # Outcome Date GA Lbr Len/2nd Weight Sex Type Anes PTL Lv  3 Term 12/17/03    F Vag-Spont   LIV  2 Term 02/12/95    F Vag-Spont   LIV  1 Term 08/11/93    Whitney Williams   LIV    Past Medical History:  Diagnosis Date   Asthma     Past Surgical History:  Procedure Laterality Date   BREAST EXCISIONAL BIOPSY Right    2003 & 2004   WRIST GANGLION EXCISION     x 5     Current Outpatient Medications:    Prenat w/o A-FE-Methfol-FA-DHA (PNV-DHA) 27-0.6-0.4-300 MG CAPS, Take 1 capsule by mouth daily before breakfast., Disp: 90 capsule, Rfl: 3   tiZANidine  (ZANAFLEX ) 4 MG tablet, Take 1 tablet (4 mg total) by mouth every 6 (six) hours  as needed for muscle spasms. (Patient not taking: Reported on 11/18/2023), Disp: 30 tablet, Rfl: 0 Allergies  Allergen Reactions   Ibuprofen Shortness Of Breath and Swelling    edema   Nsaids Hives    Social History   Tobacco Use   Smoking status: Former    Current packs/day: 0.00    Types: Cigarettes    Quit date: 10/17/2007    Years since quitting: 16.1   Smokeless tobacco: Never  Substance Use Topics   Alcohol use: No    Family History  Problem Relation Age of Onset   Breast cancer Cousin 68   Breast cancer Cousin 46      Review of Systems  Constitutional: negative for fatigue and weight loss Respiratory: negative for cough and wheezing Cardiovascular: negative for chest pain, fatigue and palpitations Gastrointestinal: negative for abdominal pain and change in bowel habits Musculoskeletal:negative for myalgias Neurological: negative for gait problems and tremors Behavioral/Psych: negative for abusive relationship, depression Endocrine: negative for temperature intolerance    Genitourinary: positive for vaginal discharge.  negative for abnormal menstrual periods, genital lesions, hot flashes, sexual problems  Integument/breast: negative for breast lump, breast tenderness, nipple discharge and skin lesion(s)    Objective:       BP 117/78   Pulse 78   Ht 5\' 3"  (1.6 m)   Wt 154 lb  3.2 oz (69.9 kg)   LMP 02/20/2022   BMI 27.32 kg/m  General:   Alert and no distress  Skin:   no rash or abnormalities  Lungs:   clear to auscultation bilaterally  Heart:   regular rate and rhythm, S1, S2 normal, no murmur, click, rub or gallop  Breasts:   normal without suspicious masses, skin or nipple changes or axillary nodes  Abdomen:  normal findings: no organomegaly, soft, non-tender and no hernia  Pelvis:  External genitalia: normal general appearance Urinary system: urethral meatus normal and bladder without fullness, nontender Vaginal: normal without tenderness, induration or  masses Cervix: normal appearance Adnexa: normal bimanual exam Uterus: anteverted and non-tender, normal size   Lab Review Urine pregnancy test Labs reviewed yes Radiologic studies reviewed yes  I have spent a total of 20 minutes of face-to-face time, excluding clinical staff time, reviewing notes and preparing to see patient, ordering tests and/or medications, and counseling the patient.   Assessment:   1. Encounter for gynecological examination with Papanicolaou smear of cervix (Primary) Rx: - Cytology - PAP( [facility])  2. Postmenopause  3. Vaginal discharge Rx: - Cervicovaginal ancillary only( [facility])  4. Screening for STD (sexually transmitted disease) Rx: - HIV antibody (with reflex) - RPR - Hepatitis C Antibody - Hepatitis B Surface AntiGEN  5. Vitamin deficiency Rx: - Prenat w/o A-FE-Methfol-FA-DHA (PNV-DHA) 27-0.6-0.4-300 MG CAPS; Take 1 capsule by mouth daily before breakfast.  Dispense: 90 capsule; Refill: 3  6. Screening for diabetes mellitus Rx: - Hemoglobin A1c  7. Screening breast examination Rx: - MM Digital Screening; Future    Plan:    Education reviewed: calcium supplements, depression evaluation, low fat, low cholesterol diet, safe sex/STD prevention, self breast exams, and weight bearing exercise. Mammogram ordered. Follow up in: 1 year.   Meds ordered this encounter  Medications   Prenat w/o A-FE-Methfol-FA-DHA (PNV-DHA) 27-0.6-0.4-300 MG CAPS    Sig: Take 1 capsule by mouth daily before breakfast.    Dispense:  90 capsule    Refill:  3   Orders Placed This Encounter  Procedures   MM Digital Screening    Standing Status:   Future    Expiration Date:   11/18/2024    Reason for Exam (SYMPTOM  OR DIAGNOSIS REQUIRED):   Screening    Is the patient pregnant?:   No    Preferred imaging location?:   [facility]   HIV antibody (with reflex)   RPR   Hepatitis C Antibody   Hepatitis B Surface AntiGEN   Hemoglobin A1c     Gabrielle Joiner, MD, FACOG Attending Obstetrician & Gynecologist, [facility] for [facility], [facility], [place] 11/18/2023

## 2023-11-18 NOTE — Progress Notes (Unsigned)
 Pt presents for annal. Pt has no questions or concerns at this time.

## 2023-11-18 NOTE — Progress Notes (Incomplete)
 Subjective:        Whitney Williams is a 58 y.o. female here for a routine exam.  Current complaints: Vaginal discharge.    Personal health questionnaire:  Is patient [name], have a family history of breast and/or ovarian cancer: yes Is there a family history of uterine cancer diagnosed at age < 73, gastrointestinal cancer, urinary tract cancer, family member who is a Personnel officer syndrome-associated carrier: no Is the patient overweight and hypertensive, family history of diabetes, personal history of gestational diabetes, preeclampsia or PCOS: no Is patient over 31, have PCOS,  family history of premature CHD under age 54, diabetes, smoke, have hypertension or peripheral artery disease:  no At any time, has a partner hit, kicked or otherwise hurt or frightened you?: no Over the past 2 weeks, have you felt down, depressed or hopeless?: no Over the past 2 weeks, have you felt little interest or pleasure in doing things?:no   Gynecologic History Patient's last menstrual period was 02/20/2022. Contraception: post menopausal status Last Pap: 04/06/2022. Results were: normal Last mammogram: 09/12/2021. Results were: normal  Obstetric History OB History  Gravida Para Term Preterm AB Living  3 3 3   3   SAB IAB Ectopic Multiple Live Births      3    # Outcome Date GA Lbr Len/2nd Weight Sex Type Anes PTL Lv  3 Term 12/17/03    F Vag-Spont   LIV  2 Term 02/12/95    F Vag-Spont   LIV  1 Term 08/11/93    [name]   LIV    Past Medical History:  Diagnosis Date  . Asthma     Past Surgical History:  Procedure Laterality Date  . BREAST EXCISIONAL BIOPSY Right    2003 & 2004  . WRIST GANGLION EXCISION     x 5     Current Outpatient Medications:  .  tiZANidine  (ZANAFLEX ) 4 MG tablet, Take 1 tablet (4 mg total) by mouth every 6 (six) hours as needed for muscle spasms. (Patient not taking: Reported on 11/18/2023), Disp: 30 tablet, Rfl: 0 Allergies  Allergen Reactions  .  Ibuprofen Shortness Of Breath and Swelling    edema  . Nsaids Hives    Social History   Tobacco Use  . Smoking status: Former    Current packs/day: 0.00    Types: Cigarettes    Quit date: 10/17/2007    Years since quitting: 16.0  . Smokeless tobacco: Never  Substance Use Topics  . Alcohol use: No    Family History  Problem Relation Age of Onset  . Breast cancer Cousin 38  . Breast cancer Cousin 46      Review of Systems  Constitutional: negative for fatigue and weight loss Respiratory: negative for cough and wheezing Cardiovascular: negative for chest pain, fatigue and palpitations Gastrointestinal: negative for abdominal pain and change in bowel habits Musculoskeletal:negative for myalgias Neurological: negative for gait problems and tremors Behavioral/Psych: negative for abusive relationship, depression Endocrine: negative for temperature intolerance    Genitourinary: positive for vaginal discharge.  negative for abnormal menstrual periods, genital lesions, hot flashes, sexual problems  Integument/breast: negative for breast lump, breast tenderness, nipple discharge and skin lesion(s)    Objective:       BP 117/78   Pulse 78   Ht 5\' 3"  (1.6 m)   Wt 154 lb 3.2 oz (69.9 kg)   LMP 02/20/2022   BMI 27.32 kg/m  General:   Alert and no distress  Skin:   no rash or abnormalities  Lungs:   clear to auscultation bilaterally  Heart:   regular rate and rhythm, S1, S2 normal, no murmur, click, rub or gallop  Breasts:   normal without suspicious masses, skin or nipple changes or axillary nodes  Abdomen:  normal findings: no organomegaly, soft, non-tender and no hernia  Pelvis:  External genitalia: normal general appearance Urinary system: urethral meatus normal and bladder without fullness, nontender Vaginal: normal without tenderness, induration or masses Cervix: normal appearance Adnexa: normal bimanual exam Uterus: anteverted and non-tender, normal size   Lab  Review Urine pregnancy test Labs reviewed yes Radiologic studies reviewed yes  I have spent a total of 20 minutes of face-to-face time, excluding clinical staff time, reviewing notes and preparing to see patient, ordering tests and/or medications, and counseling the patient.   Assessment:    1. Encounter for gynecological examination with Papanicolaou smear of cervix (Primary) Rx: - Cytology - PAP( [facility])  2. Postmenopause  3. Vaginal discharge Rx: - Cervicovaginal ancillary only( [facility])  4. Screening for STD (sexually transmitted disease) Rx: - HIV antibody (with reflex) - RPR - Hepatitis C Antibody - Hepatitis B Surface AntiGEN  5. Vitamin deficiency Rx: - Prenat w/o A-FE-Methfol-FA-DHA (PNV-DHA) 27-0.6-0.4-300 MG CAPS; Take 1 capsule by mouth daily before breakfast.  Dispense: 90 capsule; Refill: 3  6. Screening for diabetes mellitus Rx: - Hemoglobin A1c     Plan:    {plan:19193}   No orders of the defined types were placed in this encounter.  Orders Placed This Encounter  Procedures  . HIV antibody (with reflex)  . RPR  . Hepatitis C Antibody  . Hepatitis B Surface AntiGEN    Gabrielle Joiner, MD, FACOG Attending Obstetrician & Gynecologist, [facility] for [facility], [facility], [place] 11/18/2023

## 2023-11-19 LAB — RPR: RPR Ser Ql: NONREACTIVE

## 2023-11-19 LAB — HEPATITIS B SURFACE ANTIGEN: Hepatitis B Surface Ag: NEGATIVE

## 2023-11-19 LAB — CYTOLOGY - PAP
Comment: NEGATIVE
Diagnosis: NEGATIVE
High risk HPV: NEGATIVE

## 2023-11-19 LAB — CERVICOVAGINAL ANCILLARY ONLY
Bacterial Vaginitis (gardnerella): NEGATIVE
Candida Glabrata: NEGATIVE
Candida Vaginitis: NEGATIVE
Chlamydia: NEGATIVE
Comment: NEGATIVE
Comment: NEGATIVE
Comment: NEGATIVE
Comment: NEGATIVE
Comment: NEGATIVE
Comment: NORMAL
Neisseria Gonorrhea: NEGATIVE
Trichomonas: NEGATIVE

## 2023-11-19 LAB — HEMOGLOBIN A1C
Est. average glucose Bld gHb Est-mCnc: 114 mg/dL
Hgb A1c MFr Bld: 5.6 % (ref 4.8–5.6)

## 2023-11-19 LAB — HEPATITIS C ANTIBODY: Hep C Virus Ab: NONREACTIVE

## 2023-11-19 LAB — HIV ANTIBODY (ROUTINE TESTING W REFLEX): HIV Screen 4th Generation wRfx: NONREACTIVE

## 2023-11-21 ENCOUNTER — Ambulatory Visit: Admitting: Obstetrics

## 2023-11-22 ENCOUNTER — Ambulatory Visit: Admitting: Obstetrics
